# Patient Record
Sex: Female | Born: 1965 | Race: White | Hispanic: No | Marital: Married | Smoking: Former smoker
Health system: Southern US, Community
[De-identification: ages and names within clinical notes are randomized; demographics above are authoritative.]

## PROBLEM LIST (undated history)

## (undated) DIAGNOSIS — Z9889 Other specified postprocedural states: Secondary | ICD-10-CM

## (undated) DIAGNOSIS — E559 Vitamin D deficiency, unspecified: Secondary | ICD-10-CM

## (undated) DIAGNOSIS — Z780 Asymptomatic menopausal state: Secondary | ICD-10-CM

## (undated) DIAGNOSIS — K649 Unspecified hemorrhoids: Secondary | ICD-10-CM

## (undated) DIAGNOSIS — J302 Other seasonal allergic rhinitis: Secondary | ICD-10-CM

## (undated) DIAGNOSIS — L509 Urticaria, unspecified: Secondary | ICD-10-CM

## (undated) DIAGNOSIS — R112 Nausea with vomiting, unspecified: Secondary | ICD-10-CM

## (undated) DIAGNOSIS — C539 Malignant neoplasm of cervix uteri, unspecified: Secondary | ICD-10-CM

## (undated) HISTORY — PX: TUBAL LIGATION: SHX77

## (undated) HISTORY — DX: Asymptomatic menopausal state: Z78.0

## (undated) HISTORY — DX: Urticaria, unspecified: L50.9

## (undated) HISTORY — PX: ENDOMETRIAL ABLATION: SHX621

## (undated) HISTORY — PX: HEMORRHOID SURGERY: SHX153

## (undated) HISTORY — DX: Vitamin D deficiency, unspecified: E55.9

---

## 1998-11-08 ENCOUNTER — Other Ambulatory Visit: Admission: RE | Admit: 1998-11-08 | Discharge: 1998-11-08 | Payer: Self-pay | Admitting: Obstetrics and Gynecology

## 1999-07-14 ENCOUNTER — Inpatient Hospital Stay (HOSPITAL_COMMUNITY): Admission: AD | Admit: 1999-07-14 | Discharge: 1999-07-17 | Payer: Self-pay | Admitting: Obstetrics and Gynecology

## 1999-07-14 ENCOUNTER — Encounter (INDEPENDENT_AMBULATORY_CARE_PROVIDER_SITE_OTHER): Payer: Self-pay

## 1999-08-23 ENCOUNTER — Other Ambulatory Visit: Admission: RE | Admit: 1999-08-23 | Discharge: 1999-08-23 | Payer: Self-pay | Admitting: Obstetrics and Gynecology

## 2002-12-16 ENCOUNTER — Other Ambulatory Visit: Admission: RE | Admit: 2002-12-16 | Discharge: 2002-12-16 | Payer: Self-pay | Admitting: Obstetrics and Gynecology

## 2003-02-27 ENCOUNTER — Ambulatory Visit (HOSPITAL_COMMUNITY): Admission: RE | Admit: 2003-02-27 | Discharge: 2003-02-27 | Payer: Self-pay | Admitting: Gastroenterology

## 2004-01-21 ENCOUNTER — Other Ambulatory Visit: Admission: RE | Admit: 2004-01-21 | Discharge: 2004-01-21 | Payer: Self-pay | Admitting: Obstetrics and Gynecology

## 2005-04-27 ENCOUNTER — Other Ambulatory Visit: Admission: RE | Admit: 2005-04-27 | Discharge: 2005-04-27 | Payer: Self-pay | Admitting: Obstetrics and Gynecology

## 2008-02-20 ENCOUNTER — Ambulatory Visit (HOSPITAL_COMMUNITY): Admission: RE | Admit: 2008-02-20 | Discharge: 2008-02-20 | Payer: Self-pay | Admitting: Surgery

## 2008-02-20 ENCOUNTER — Encounter (INDEPENDENT_AMBULATORY_CARE_PROVIDER_SITE_OTHER): Payer: Self-pay | Admitting: Surgery

## 2010-12-13 NOTE — Op Note (Signed)
Regina Salinas, Regina Salinas                  ACCOUNT NO.:  [id number]   MEDICAL RECORD NO.:  [id number]          PATIENT TYPE:  AMB   LOCATION:  SDS                          FACILITY:  [facility]   PHYSICIAN:  Thomas A. Cornett, M.D.DATE OF BIRTH:  1966-05-26   DATE OF PROCEDURE:  02/20/2008  DATE OF DISCHARGE:  02/20/2008                               OPERATIVE REPORT   PREOPERATIVE DIAGNOSIS:  Grade 3 prolapsed internal and external  hemorrhoids.   POSTOPERATIVE DIAGNOSIS:  Grade 3 prolapsed internal and external  hemorrhoids.   PROCEDURE:  Two columns internal/external hemorrhoidectomy.   SURGEON:  Maisie Fus A. Cornett, MD   ANESTHESIA:  General endotracheal anesthesia with 0.25% Sensorcaine  local.   ESTIMATED BLOOD LOSS:  50 mL.   SPECIMEN:  Hemorrhoid tissue to pathology.   INDICATIONS FOR PROCEDURE:  The patient is a 45 year old female who has  had a longstanding history of internal and external hemorrhoids.  She  had been treated with medical management and injection therapy, but  unfortunately, her disease has not responded well to this.  She presents  for hemorrhoidectomy after discussion of hemorrhoidectomy versus PPH.  She understands the pros and cons of age and wished to undergo a formal  excision hemorrhoidectomy.   DESCRIPTION OF PROCEDURE:  The patient was brought to the operating room  and placed supine.  After induction of general anesthesia, the patient  was placed in stirrups and the perianal region was prepped and draped in  sterile fashion.  Digital examination was performed first.  The tone was  normal.  Upon placement of the anoscope, she had two large piles  involving the left lateral and right lateral positions.  The left  lateral was addressed first.  This was grabbed with an Allis clamp.  A  stitch was placed in the apex of the hemorrhoid.  The entire hemorrhoid  was excised down with the external component taking care not to injure  the sphincter mechanism.   This was closed with a running 3-0 Monocryl  leaving the bottom open.  The next pile on the right side was  identified.  The stitch was placed in a similar fashion using 3-0  Monocryl in the apex.  The hemorrhoid pile was excised.  This was then  closed with a running 3-0 Monocryl.  The bottom was left open.  There  was some oozing from the skin and this was controlled with cautery.  The  remainder of her anoscopy was normal.  The sphincter was intact.  I then  injected with 0.25% Sensorcaine in a circumanal pattern.  We then placed  Surgicel and Gelfoam as a plug in the anal canal.  Lidocaine 2% gel was then applied.  All final counts of sponge, needle,  and instruments were found be correct at this portion of the case.  The  patient was awoke and taken to recovery after being taken out of  lithotomy in satisfactory condition.      Thomas A. Cornett, M.D.  Electronically Signed     TAC/MEDQ  D:  02/20/2008  T:  02/21/2008  Job:  [id number]   cc:   Juluis Mire, M.D.  Graylin Shiver, M.D.

## 2010-12-16 NOTE — Op Note (Signed)
   NAME:  Regina Salinas, Regina Salinas                            ACCOUNT NO.:  [id number]   MEDICAL RECORD NO.:  [id number]                   PATIENT TYPE:  AMB   LOCATION:  ENDO                                 FACILITY:  [facility]   PHYSICIAN:  Graylin Shiver, M.D.                DATE OF BIRTH:  Feb 25, 1966   DATE OF PROCEDURE:  DATE OF DISCHARGE:                                 OPERATIVE REPORT   PROCEDURE:  Colonoscopy.   INDICATION FOR PROCEDURE:  Rectal bleeding.   INFORMED CONSENT:  Informed consent was obtained after explanation of the  risks of bleeding, infection, and perforation.   PREMEDICATIONS:  1. Fentanyl 100 mcg IV.  2. Versed 9 mg IV.   DESCRIPTION OF PROCEDURE:  With the patient in the left lateral decubitus  position, a rectal exam was performed, and no masses were felt.  The Olympus  colonoscope was inserted into the rectum and advanced around the colon to  the cecum.  The cecal landmarks were identified.  The cecum and ascending  colon were normal.  The transverse colon was normal.  The descending colon,  sigmoid, and rectum were normal.  Some internal hemorrhoids were present.  She tolerated the procedure well without complications.   IMPRESSION:  Normal colonoscopy to the cecum with internal hemorrhoids.                                               Graylin Shiver, M.D.    Germain Osgood  D:  02/27/2003  T:  02/27/2003  Job:  [id number]   cc:   Juluis Mire, M.D.  [address] [place]  [place] [address]  Fax: [phone]

## 2010-12-16 NOTE — H&P (Signed)
[facility]  Patient:    Regina Salinas                          MRN: [id number] Proc. Date: 07/13/99 Adm. Date:  [id number] Attending:  Frederich Balding                         History and Physical  HISTORY OF PRESENT ILLNESS:   The patient is a 45 year old, gravida 3, para 2, married white female.  Her last menstrual period was on March 15, given the estimated date of confinement of December 20.  This gives her an estimated gestational age of [redacted] weeks.  This was consistent with initial examination and prior ultrasound.  She presents now for repeat cesarean section.  The patient was at risk for advanced maternal age, being 84 at the birth of this child.  She did undergo amniocentesis with a finding of a chromosomally normal female with normal amniotic fluid, alpha-fetoprotein levels.  She did have a cesarean section with her last pregnancy due to failure to progress.  Prior to hat though she had had a successful vaginal delivery.  After discussions of risks and benefits and possible trial of labor, the patient decided to proceed with a repeat cesarean section for which she comes in at the present time.  ALLERGIES:                    In terms of allergies she has no known drug allergies.  MEDICATIONS:                  Prenatal vitamins.  PRENATAL LABORATORY DATA:     The patient is A positive.  Negative antibody screen. Nonreactive serology.  Negative hepatitis B surface antigen.  Her tuberculin skin test was negative.  Group B strep was also negative.  Her 50 g Glucola was 89.   For past medical history, family history, and social history, please see prenatal records.  REVIEW OF SYSTEMS:            Noncontributory.  PHYSICAL EXAMINATION:  VITAL SIGNS:                  The patient is afebrile with stable vital signs.  HEENT:                        The patient normocephalic.  Pupils, equal, round,  reactive to light and  accommodation.  Extraocular movements were intact. Sclerae and conjunctivae clear.  Oropharynx clear.  LUNGS:                        Clear.  HEART:                        Regular rate and rhythm with 2/6 systolic ejection murmur.  No clicks or gallops.  ABDOMEN:                      Gravid uterus consistent with dates, otherwise unremarkable.  Well-healed low transverse incision.  PELVIC:                       On pelvic examination cervix is long and closed and vertex presenting.  EXTREMITIES:  Trace edema.  NEUROLOGICAL:                 Grossly within normal limits.  IMPRESSION:                   1. Intrauterine pregnancy, at term with prior                                  cesarean section and desirous of repeat.                               2. Advanced maternal age with normal amniocentesis                                  findings.  PLAN:                         The patient will undergo a repeat cesarean section. The risks and benefits have been discussed including the risk of infection, the  risk of hemorrhage that could necessitate transfusion with the risks of AIDS or  hepatitis, the risk of injury to adjacent organs such as bladder, bowel, or ureters that could require further exploratory surgery, the risk of deep vein thrombosis, and pulmonary embolus.  The patient expressed understanding of indications and risks and accepts them.  She also understands the potential for a trail of labor which she declined. DD:  07/14/99 TD:  07/14/99 Job: [id number]

## 2010-12-16 NOTE — Op Note (Signed)
[facility]  Patient:    Regina Salinas                          MRN: [id number] Proc. Date: 07/14/99 Adm. Date:  [id number] Attending:  Frederich Balding                           Operative Report  PREOPERATIVE DIAGNOSIS:       Intrauterine pregnancy at term with prior cesarean section, desires repeat.  Multiparity, desires sterility.  POSTOPERATIVE DIAGNOSIS:      Intrauterine pregnancy at term with prior cesarean section, desires repeat.  Multiparity, desires sterility.  OPERATION:                    Low transverse cesarean section with bilateral tubal ligation.  SURGEON:                      Juluis Mire, M.D.  ASSISTANT:  ANESTHESIA:                   Epidural.  ESTIMATED BLOOD LOSS:         800 cc.  PACKS AND DRAINS:             None.  BLOOD REPLACED:               None.  COMPLICATIONS:                None.  INDICATIONS:                  The patient is a 45 year old, gravida 3, para 2, married white female with prior cesarean section.  She is desirous of repeat. he option of trial of labor had been discussed and declined by the patient.  Also desirous of permanent sterilization.  Alternative forms of birth control were discussed.  A failure rate of 1 in 200 was quoted.  Failures can be in the form of ectopic pregnancy requiring further surgical management.  The risks of the overall surgery was discussed including the risks of infection.  The risks of hemorrhage that could necessitate transfusion with the risk of AIDS or hepatitis.  The risk of injury to adjacent organs including bladder, bowel, or ureters that could require further exploratory surgery.  The risk of deep venous thrombosis and pulmonary embolus.  DESCRIPTION OF PROCEDURE:     The patient was taken to the OR and placed in the  supine position with a left lateral tilt.  After a satisfactory level of epidural anesthesia was obtained, the abdomen was prepped  with Betadine and draped as a sterile field.  A prior low transverse incision was identified and excised. The incision was then extended through the subcutaneous tissue.  The fascia was entered sharply and the fascia was extended laterally.  The fascia was taken off of the  muscles superiorly and inferiorly.  The rectus muscles were separated in the midline.  The anterior peritoneum was entered sharply.  The incision to the peritoneum was extended both superiorly and inferiorly.  There was no evidence f injury to adjacent organs.  The bladder flap was easily developed.  A low transverse uterine incision was begun with the knife and extended laterally using manual traction.  The infant presented in the vertex presentation and was delivered with elevation of the head and fundal pressure.  The placenta  was then delivered manually.  The uterus was wiped free of remaining membranes and placenta.  The uterus was closed with a running locking suture of 0 chromic using a two layer closure technique with excellent hemostasis.  The uterus was then exteriorized.  Tubes and ovaries were identified and noted to be unremarkable.  The midsegment of each tube was grasped with a Babcock tenaculum.  A hole was made in the avascular area of the mesosalpinx using the cautery.  Individual ligatures of 0 plain catgut were used to ligate off a segment of tube.  The intervening segment of tube was  then excised.  The cut ends of the tubes were cauterized using the Bovie.  Both  sides were adequately hemostatic.  The uterus was returned to the abdominal cavity. The pelvic cavity was thoroughly irrigated.  Hemostasis was excellent.  Urine output remained clear and adequate.  Muscles were reapproximated with a running  suture of 3-0 Vicryl.  The fascia was closed with a running suture of 0 PDS. The skin was closed with staples and Steri-Strips.  Sponge, needle, and instrument counts were correct by  circulating nurse x 2.  Foley catheter remained clear at the time of closure.  The patient tolerated the procedure well and was returned to he recovery room in good condition. DD:  07/14/99 TD:  07/14/99 Job: [id number]

## 2011-04-28 LAB — CBC
Hemoglobin: 10.5 — ABNORMAL LOW
MCHC: 33.3
Platelets: 335
RDW: 15.2

## 2014-10-27 ENCOUNTER — Other Ambulatory Visit: Payer: Self-pay | Admitting: Obstetrics and Gynecology

## 2014-10-28 LAB — CYTOLOGY - PAP

## 2015-11-22 ENCOUNTER — Other Ambulatory Visit: Payer: Self-pay | Admitting: Obstetrics and Gynecology

## 2015-11-22 DIAGNOSIS — R928 Other abnormal and inconclusive findings on diagnostic imaging of breast: Secondary | ICD-10-CM

## 2015-11-26 ENCOUNTER — Ambulatory Visit
Admission: RE | Admit: 2015-11-26 | Discharge: 2015-11-26 | Disposition: A | Discharge status: Home / Self Care | Payer: 59 | Source: Ambulatory Visit | Attending: Obstetrics and Gynecology | Admitting: Obstetrics and Gynecology

## 2015-11-26 DIAGNOSIS — R928 Other abnormal and inconclusive findings on diagnostic imaging of breast: Secondary | ICD-10-CM

## 2016-05-08 ENCOUNTER — Other Ambulatory Visit: Payer: Self-pay | Admitting: Obstetrics and Gynecology

## 2016-05-08 DIAGNOSIS — N6001 Solitary cyst of right breast: Secondary | ICD-10-CM

## 2016-05-29 ENCOUNTER — Other Ambulatory Visit: Payer: 59

## 2016-06-27 ENCOUNTER — Ambulatory Visit
Admission: RE | Admit: 2016-06-27 | Discharge: 2016-06-27 | Disposition: A | Discharge status: Home / Self Care | Payer: 59 | Source: Ambulatory Visit | Attending: Obstetrics and Gynecology | Admitting: Obstetrics and Gynecology

## 2016-06-27 DIAGNOSIS — N6001 Solitary cyst of right breast: Secondary | ICD-10-CM

## 2018-02-15 ENCOUNTER — Encounter: Payer: Self-pay | Admitting: *Deleted

## 2018-02-15 ENCOUNTER — Other Ambulatory Visit: Payer: Self-pay | Admitting: *Deleted

## 2018-02-18 ENCOUNTER — Encounter: Payer: Self-pay | Admitting: Gynecologic Oncology

## 2018-02-18 ENCOUNTER — Inpatient Hospital Stay: Payer: 59 | Attending: Gynecologic Oncology | Admitting: Gynecologic Oncology

## 2018-02-18 VITALS — BP 110/59 | HR 56 | Temp 98.1°F | Resp 20 | Ht 64.0 in | Wt 178.1 lb

## 2018-02-18 DIAGNOSIS — F17210 Nicotine dependence, cigarettes, uncomplicated: Secondary | ICD-10-CM

## 2018-02-18 DIAGNOSIS — C801 Malignant (primary) neoplasm, unspecified: Secondary | ICD-10-CM | POA: Insufficient documentation

## 2018-02-18 DIAGNOSIS — R87610 Atypical squamous cells of undetermined significance on cytologic smear of cervix (ASC-US): Secondary | ICD-10-CM

## 2018-02-18 DIAGNOSIS — R87810 Cervical high risk human papillomavirus (HPV) DNA test positive: Secondary | ICD-10-CM | POA: Diagnosis not present

## 2018-02-18 NOTE — Patient Instructions (Addendum)
Plan to have a cold knife conization of the cervix and dilation and curettage of the uterus at the [facility] on February 21, 2018 with Dr. Everitt Amber.  You will receive a phone call from the pre-surgical RN to discuss instructions.  Please call for any questions or concerns.   Cervical Conization Cervical conization (cone biopsy) is a procedure in which a cone-shaped portion of the cervix is cut out so that it can be examined under a microscope. The procedure is done to check for cancer cells or cells that might turn into cancer (precancerous cells). You may have this procedure if:  You have abnormal bleeding from your cervix.  You had an abnormal Pap test.  Something abnormal was seen on your cervix during an exam.  This procedure is performed in either a health care provider's office or in an operating room. Tell a health care provider about:  Any allergies you have.  All medicines you are taking, including vitamins, herbs, eye drops, creams, and over-the-counter medicines.  Any problems you or family members have had with the use of anesthetic medicines.  Any blood disorders you have.  Any surgeries you have had.  Any medical conditions you have.  Your smoking habits.  When you normally have your period.  Whether you are pregnant or may be pregnant. What are the risks? Generally, this is a safe procedure. However, problems may occur, including:  Heavy bleeding for several days or weeks after the procedure.  Allergic reactions to medicines or dyes.  Increased risk of preterm labor in future pregnancies.  Infection (rare).  Damage to the cervix or other structures or organs (rare).  What happens before the procedure? Staying hydrated Follow instructions from your health care provider about hydration, which may include:  Up to 2 hours before the procedure - you may continue to drink clear liquids, such as water, clear fruit juice, black coffee,  and plain tea.  Eating and drinking restrictions Follow instructions from your health care provider about eating and drinking, which may include:  8 hours before the procedure - stop eating heavy meals or foods such as meat, fried foods, or fatty foods.  6 hours before the procedure - stop eating light meals or foods, such as toast or cereal.  6 hours before the procedure - stop drinking milk or drinks that contain milk.  2 hours before the procedure - stop drinking clear liquids.  General instructions  Do not douche, have sex, use tampons, or use any vaginal medicines before the procedure as told by your health care provider.  You may be asked to empty your bladder and bowel right before the procedure.  Ask your health care provider about: ? Changing or stopping your normal medicines. This is important if you take diabetes medicines or blood thinners. ? Taking medicines such as aspirin and ibuprofen. These medicines can thin your blood. Do not take these medicines before your procedure if your doctor tells you not to.  Plan to have someone take you home from the hospital or clinic. What happens during the procedure?  To reduce your risk of infection: ? Your health care team will wash or sanitize their hands. ? Your skin will be washed with soap. ? Hair may be removed from the surgical area.  You will undress from the waist down and be given a gown to wear.  You will lie on an examining table and put your feet in stirrups.  An IV tube  will be inserted into one of your veins.  You will be given one or more of the following: ? A medicine to help you relax (sedative). ? A medicine to numb the area (local anesthetic). ? A medicine to make you fall asleep (general anesthetic). ? A medicine that numbs the cervix (cervical block).  A lubricated device called a speculum will be inserted into your vagina. It will be used to spread open the walls of the vagina so your health care  provider can see the inside of the vagina and cervix better.  An instrument that has a magnifying lens and a light (colposcope) will let your health care provider examine the cervix more closely.  Your health care provider will apply a solution to your cervix. This turns abnormal areas a pale color.  A tissue sample will be removed from the cervix using one of the following methods: ? The cold knife method. In this method, the tissue is cut out with a knife (scalpel). ? The loop electrosurgical excision procedure (LEEP) method. In this method, the tissue is cut out with a thin wire that can burn (cauterize) the tissue with an electrical current. ? Laser treatment method. In this method, the tissue is cut out and then cauterized with a laser beam to prevent bleeding.  Your health care provider will apply a paste over the biopsy areas to help control bleeding.  The tissue sample will be examined under a microscope. The procedure may vary among health care providers and hospitals. What happens after the procedure?  Your blood pressure, heart rate, breathing rate, and blood oxygen level will be monitored often until the medicines you were given have worn off.  If you were given a local anesthetic, you will rest at the clinic or hospital until you are stable and feel ready to go home.  If you were given a general anesthetic, you may be monitored for a longer period of time.  You may have some cramping.  You may have bloody discharge or light to moderate bleeding.  You may have dark discharge coming from your vagina. This is from the paste used on the cervix to prevent bleeding. Summary  Cervical conization is a procedure in which a cone-shaped portion of the cervix is cut out so that it can be examined under a microscope.  The procedure is done to check for cancer cells or cells that might turn into cancer (precancerous cells). This information is not intended to replace advice given to  you by your health care provider. Make sure you discuss any questions you have with your health care provider. Document Released: 04/26/2005 Document Revised: 07/19/2016 Document Reviewed: 07/19/2016 Elsevier Interactive Patient Education  2017 [employer]   Dilation and Curettage or Vacuum Curettage Dilation and curettage (D&C) and vacuum curettage are minor procedures. A D&C involves stretching (dilation) the cervix and scraping (curettage) the inside lining of the uterus (endometrium). During a D&C, tissue is gently scraped from the endometrium, starting from the top portion of the uterus down to the lowest part of the uterus (cervix). During a vacuum curettage, the lining and tissue in the uterus are removed with the use of gentle suction. Curettage may be performed to either diagnose or treat a problem. As a diagnostic procedure, curettage is performed to examine tissues from the uterus. A diagnostic curettage may be done if you have:  Irregular bleeding in the uterus.  Bleeding with the development of clots.  Spotting between menstrual periods.  Prolonged  menstrual periods or other abnormal bleeding.  Bleeding after menopause.  No menstrual period (amenorrhea).  A change in size and shape of the uterus.  Abnormal endometrial cells discovered during a Pap test.  As a treatment procedure, curettage may be performed for the following reasons:  Removal of an IUD (intrauterine device).  Removal of retained placenta after giving birth.  Abortion.  Miscarriage.  Removal of endometrial polyps.  Removal of uncommon types of noncancerous lumps (fibroids).  Tell a health care provider about:  Any allergies you have, including allergies to prescribed medicine or latex.  All medicines you are taking, including vitamins, herbs, eye drops, creams, and over-the-counter medicines. This is especially important if you take any blood-thinning medicine. Bring a list of all of your  medicines to your appointment.  Any problems you or family members have had with anesthetic medicines.  Any blood disorders you have.  Any surgeries you have had.  Your medical history and any medical conditions you have.  Whether you are pregnant or may be pregnant.  Recent vaginal infections you have had.  Recent menstrual periods, bleeding problems you have had, and what form of birth control (contraception) you use. What are the risks? Generally, this is a safe procedure. However, problems may occur, including:  Infection.  Heavy vaginal bleeding.  Allergic reactions to medicines.  Damage to the cervix or other structures or organs.  Development of scar tissue (adhesions) inside the uterus, which can cause abnormal amounts of menstrual bleeding. This may make it harder to get pregnant in the future.  A hole (perforation) or puncture in the uterine wall. This is rare.  What happens before the procedure? Staying hydrated Follow instructions from your health care provider about hydration, which may include:  Up to 2 hours before the procedure - you may continue to drink clear liquids, such as water, clear fruit juice, black coffee, and plain tea.  Eating and drinking restrictions Follow instructions from your health care provider about eating and drinking, which may include:  8 hours before the procedure - stop eating heavy meals or foods such as meat, fried foods, or fatty foods.  6 hours before the procedure - stop eating light meals or foods, such as toast or cereal.  6 hours before the procedure - stop drinking milk or drinks that contain milk.  2 hours before the procedure - stop drinking clear liquids. If your health care provider told you to take your medicine(s) on the day of your procedure, take them with only a sip of water.  Medicines  Ask your health care provider about: ? Changing or stopping your regular medicines. This is especially important if you  are taking diabetes medicines or blood thinners. ? Taking medicines such as aspirin and ibuprofen. These medicines can thin your blood. Do not take these medicines before your procedure if your health care provider instructs you not to.  You may be given antibiotic medicine to help prevent infection. General instructions  For 24 hours before your procedure, do not: ? Douche. ? Use tampons. ? Use medicines, creams, or suppositories in the vagina. ? Have sexual intercourse.  You may be given a pregnancy test on the day of the procedure.  Plan to have someone take you home from the hospital or clinic.  You may have a blood or urine sample taken.  If you will be going home right after the procedure, plan to have someone with you for 24 hours. What happens during the procedure?  To reduce your risk of infection: ? Your health care team will wash or sanitize their hands. ? Your skin will be washed with soap.  An IV tube will be inserted into one of your veins.  You will be given one of the following: ? A medicine that numbs the area in and around the cervix (local anesthetic). ? A medicine to make you fall asleep (general anesthetic).  You will lie down on your back, with your feet in foot rests (stirrups).  The size and position of your uterus will be checked.  A lubricated instrument (speculum or Sims retractor) will be inserted into the back side of your vagina. The speculum will be used to hold apart the walls of your vagina so your health care provider can see your cervix.  A tool (tenaculum) will be attached to the lip of the cervix to stabilize it.  Your cervix will be softened and dilated. This may be done by: ? Taking a medicine. ? Having tapered dilators or thin rods (laminaria) or gradual widening instruments (tapered dilators) inserted into your cervix.  A small, sharp, curved instrument (curette) will be used to scrape a small amount of tissue or cells from the  endometrium or cervical canal. In some cases, gentle suction is applied with the curette. The curette will then be removed. The cells will be taken to a lab for testing. The procedure may vary among health care providers and hospitals. What happens after the procedure?  You may have mild cramping, backache, pain, and light bleeding or spotting. You may pass small blood clots from your vagina.  You may have to wear compression stockings. These stockings help to prevent blood clots and reduce swelling in your legs.  Your blood pressure, heart rate, breathing rate, and blood oxygen level will be monitored until the medicines you were given have worn off. Summary  Dilation and curettage (D&C) involves stretching (dilation) the cervix and scraping (curettage) the inside lining of the uterus (endometrium).  After the procedure, you may have mild cramping, backache, pain, and light bleeding or spotting. You may pass small blood clots from your vagina.  Plan to have someone take you home from the hospital or clinic. This information is not intended to replace advice given to you by your health care provider. Make sure you discuss any questions you have with your health care provider. Document Released: 07/17/2005 Document Revised: 04/02/2016 Document Reviewed: 04/02/2016 Elsevier Interactive Patient Education  2018 [employer].

## 2018-02-18 NOTE — Progress Notes (Signed)
Consult Note: Gyn-Onc  Consult was requested by Dr. Radene Knee for the evaluation of Regina Salinas 52 y.o. female  CC:  Chief Complaint  Patient presents with  . Adenocarcinoma [facility])    Assessment/Plan:  [name]  is a 52 y.o.  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  On examination there is no macroscopic lesion on the cervix, and therefore this is a cervical primary, it is likely a stage IA process.  There is a possibility that this could be extension to the cervix from a primary endometrial cancer, however this would be unlikely in the setting of an absence of vaginal bleeding symptoms, and in the concurrence of an abnormal Pap smear with high risk HPV detected.  Therefore I am favoring the likelihood of this being a primary cervical process.  I discussed with [name] that differentiating the 2 sites of origin are critically important when determining the most appropriate type of hysterectomy (radical versus extrafascial) that should be performed.  In order to best differentiate this and to stage of microscopic cervical cancer, I am recommending proceeding with a cold knife conization of the cervix and D&C procedure on February 21, 2018.  I explained the procedure of a cone and its associated risks including bleeding, infection, damage to adjacent structures.  After we have determined the pathology definitively, we will then proceed with preoperative imaging if appropriate followed by hysterectomy as the patient has completed childbearing.  Hysterectomy would not be appropriate if pathology revealed a locally invasive process or metastatic disease on the preoperative work-up.  I will see her back approximately 2 weeks after a cone biopsy and D&C to discuss the pathology and plan for definitive treatment.   HPI: Regina Salinas is a very pleasant 51 year old P3 who was seen in consultation at the request of Dr. Radene Knee for adenocarcinoma of the endocervix and cervix.  The  patient was seen for routine gynecologic evaluation with Dr. Renold Don on January 23, 2018.  At that time a Pap test was performed with HPV testing.  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  To follow this up she then underwent a colposcopic examination of the cervix on February 07, 2018.  Review of the colposcopy notes revealed an adequate colposcopy.  She did have acetowhite changes with slight punctation located on the left side of the cervix near the opening.  This was biopsied along with endocervical curettings taken.  The pathology from this colposcopy with biopsies revealed in the cervical biopsy at 3:00 adenocarcinoma, and in the endocervical curettage adenocarcinoma.  The comment and pathology was that both of the specimens show fragments of adenocarcinoma and while some of the features suggest an endometrial origin, differential includes endocervical and endometrial origin.  There is minimal stroma present in the tumor fragments which limits evaluation of invasion.  The patient denies having symptoms of postmenopausal bleeding, postcoital bleeding, abnormal discharge, or pelvic pain.  She underwent endometrial ablation in, approximately 2012.  She has been amenorrheic since this time.  There is evidence of a benign endometrial biopsy in 2011.  She reports a personal history of an abnormal Pap smear in 1990, that was treated with cryotherapy.  She had an additional abnormal Pap smears some years later however this was cleared with treatment for an infection.  She denies having abnormal Pap smears since that time.  There is evidence of a cytologically benign Pap smear in 2016.  HPV testing was not performed on that specimen.  The patient's family history is significant for a maternal grandmother with a history of colon cancer, and a maternal aunt with a history of cervical cancer.  The patient herself is fairly healthy.  She is  not obese.  She does have a history of sporadic allergic reactions with hives and swelling on one side of her body.  She is undergone multiple allergy testing, however no explanation has been revealed.  Surgical history is significant for 2 prior cesarean sections.  Of note she is also had one vaginal delivery.  She also had an endometrial ablation as previously stated.  She is a non-smoker.   Current Meds:  Outpatient Encounter Medications as of 02/18/2018  Medication Sig  . cetirizine (ZYRTEC) 10 MG tablet Take 10 mg by mouth 2 (two) times daily.  . cholecalciferol (VITAMIN D) 1000 units tablet Take 1,000 Units by mouth daily.  . ranitidine (ZANTAC) 150 MG tablet Take 150 mg by mouth 2 (two) times daily.   No facility-administered encounter medications on file as of 02/18/2018.     Allergy:  Allergies  Allergen Reactions  . Other     Mild garlic    Social Hx:   Social History   Socioeconomic History  . Marital status: Married    Spouse name: Not on file  . Number of children: Not on file  . Years of education: Not on file  . Highest education level: Not on file  Occupational History  . Not on file  Social Needs  . Financial resource strain: Not on file  . Food insecurity:    Worry: Not on file    Inability: Not on file  . Transportation needs:    Medical: Not on file    Non-medical: Not on file  Tobacco Use  . Smoking status: Current Every Day Smoker    Packs/day: 0.50    Types: Cigarettes  . Smokeless tobacco: Never Used  . Tobacco comment: Patient has been smoking since she was 23., patient smokes less than half a pack  Substance and Sexual Activity  . Alcohol use: Yes    Comment: Ocassional  . Drug use: Never  . Sexual activity: Not on file  Lifestyle  . Physical activity:    Days per week: Not on file    Minutes per session: Not on file  . Stress: Not on file  Relationships  . Social connections:    Talks on phone: Not on file    Gets together: Not on  file    Attends religious service: Not on file    Active member of club or organization: Not on file    Attends meetings of clubs or organizations: Not on file    Relationship status: Not on file  . Intimate partner violence:    Fear of current or ex partner: Not on file    Emotionally abused: Not on file    Physically abused: Not on file    Forced sexual activity: Not on file  Other Topics Concern  . Not on file  Social History Narrative  . Not on file    Past Surgical Hx:  Past Surgical History:  Procedure Laterality Date  . CESAREAN SECTION     2000, 1995  . ENDOMETRIAL ABLATION    . OTHER SURGICAL HISTORY  2009 or 2010   Hemorrhoidectomy    Past Medical Hx:  Past Medical History:  Diagnosis Date  . Postmenopausal   .  Vitamin D deficiency     Past Gynecological History:  Remote history of abnormal pap with cryo in 1990. Normal pap in 2016. ASCUS pap in June, 2019 with high risk HPV detected. C/s x 2 and SVD x 1. Endometrial ablation in approximately 2012 (amenorrheic since that time). No LMP recorded.  Family Hx:  Family History  Problem Relation Age of Onset  . Hypertension Mother   . Lupus Sister   . Cervical cancer Maternal Aunt   . Colon cancer Maternal Grandfather     Review of Systems:  Constitutional  Feels well,   ENT Normal appearing ears and nares bilaterally Skin/Breast  No rash, sores, jaundice, itching, dryness Cardiovascular  No chest pain, shortness of breath, or edema  Pulmonary  No cough or wheeze.  Gastro Intestinal  No nausea, vomitting, or diarrhoea. No bright red blood per rectum, no abdominal pain, change in bowel movement, or constipation.  Genito Urinary  No frequency, urgency, dysuria, no bleeding or discharge. Musculo Skeletal  No myalgia, arthralgia, joint swelling or pain  Neurologic  No weakness, numbness, change in gait,  Psychology  No depression, anxiety, insomnia.   Vitals:  Blood pressure (!) 110/59, pulse (!) 56,  temperature 98.1 F (36.7 C), temperature source Oral, resp. rate 20, height 5\' 4"  (1.626 m), weight 178 lb 1.6 oz (80.8 kg), SpO2 100 %.  Physical Exam: WD in NAD Neck  Supple NROM, without any enlargements.  Lymph Node Survey No cervical supraclavicular or inguinal adenopathy Cardiovascular  Pulse normal rate, regularity and rhythm. S1 and S2 normal.  Lungs  Clear to auscultation bilateraly, without wheezes/crackles/rhonchi. Good air movement.  Skin  No rash/lesions/breakdown  Psychiatry  Alert and oriented to person, place, and time  Abdomen  Normoactive bowel sounds, abdomen soft, non-tender and thin without evidence of hernia.  Back No CVA tenderness Genito Urinary  Vulva/vagina: Normal external female genitalia.   No lesions. No discharge or bleeding.  Bladder/urethra:  No lesions or masses, well supported bladder  Vagina: normal  Cervix: Normal appearing, normal to palpate. There is a hyperemic area at 3 0'clock of the external os where the biopsy had been taken but no gross residual/visible tumor there.  Uterus:  Small, mobile, no parametrial involvement or nodularity.  Adnexa: no palpable masses. Rectal  Good tone, no masses no cul de sac nodularity. No parametrial disease palpated.  Extremities  No bilateral cyanosis, clubbing or edema.   [name], MD  02/18/2018, 10:01 AM

## 2018-02-18 NOTE — H&P (View-Only) (Signed)
Consult Note: Gyn-Onc  Consult was requested by Dr. Radene Knee for the evaluation of Regina Salinas 52 y.o. female  CC:  Chief Complaint  Patient presents with  . Adenocarcinoma [facility])    Assessment/Plan:  [name]  is a 52 y.o.  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  On examination there is no macroscopic lesion on the cervix, and therefore this is a cervical primary, it is likely a stage IA process.  There is a possibility that this could be extension to the cervix from a primary endometrial cancer, however this would be unlikely in the setting of an absence of vaginal bleeding symptoms, and in the concurrence of an abnormal Pap smear with high risk HPV detected.  Therefore I am favoring the likelihood of this being a primary cervical process.  I discussed with Regina Salinas that differentiating the 2 sites of origin are critically important when determining the most appropriate type of hysterectomy (radical versus extrafascial) that should be performed.  In order to best differentiate this and to stage of microscopic cervical cancer, I am recommending proceeding with a cold knife conization of the cervix and D&C procedure on February 21, 2018.  I explained the procedure of a cone and its associated risks including bleeding, infection, damage to adjacent structures.  After we have determined the pathology definitively, we will then proceed with preoperative imaging if appropriate followed by hysterectomy as the patient has completed childbearing.  Hysterectomy would not be appropriate if pathology revealed a locally invasive process or metastatic disease on the preoperative work-up.  I will see her back approximately 2 weeks after a cone biopsy and D&C to discuss the pathology and plan for definitive treatment.   HPI: Regina Salinas is a very pleasant 52 year old P3 who was seen in consultation at the request of Dr. Radene Knee for adenocarcinoma of the endocervix and cervix.  The  patient was seen for routine gynecologic evaluation with Dr. Renold Don on January 23, 2018.  At that time a Pap test was performed with HPV testing.  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  To follow this up she then underwent a colposcopic examination of the cervix on February 07, 2018.  Review of the colposcopy notes revealed an adequate colposcopy.  She did have acetowhite changes with slight punctation located on the left side of the cervix near the opening.  This was biopsied along with endocervical curettings taken.  The pathology from this colposcopy with biopsies revealed in the cervical biopsy at 3:00 adenocarcinoma, and in the endocervical curettage adenocarcinoma.  The comment and pathology was that both of the specimens show fragments of adenocarcinoma and while some of the features suggest an endometrial origin, differential includes endocervical and endometrial origin.  There is minimal stroma present in the tumor fragments which limits evaluation of invasion.  The patient denies having symptoms of postmenopausal bleeding, postcoital bleeding, abnormal discharge, or pelvic pain.  She underwent endometrial ablation in, approximately 2012.  She has been amenorrheic since this time.  There is evidence of a benign endometrial biopsy in 2011.  She reports a personal history of an abnormal Pap smear in 1990, that was treated with cryotherapy.  She had an additional abnormal Pap smears some years later however this was cleared with treatment for an infection.  She denies having abnormal Pap smears since that time.  There is evidence of a cytologically benign Pap smear in 2016.  HPV testing was not performed on that specimen.  The patient's family history is significant for a maternal grandmother with a history of colon cancer, and a maternal aunt with a history of cervical cancer.  The patient herself is fairly healthy.  She is  not obese.  She does have a history of sporadic allergic reactions with hives and swelling on one side of her body.  She is undergone multiple allergy testing, however no explanation has been revealed.  Surgical history is significant for 2 prior cesarean sections.  Of note she is also had one vaginal delivery.  She also had an endometrial ablation as previously stated.  She is a non-smoker.   Current Meds:  Outpatient Encounter Medications as of 02/18/2018  Medication Sig  . cetirizine (ZYRTEC) 10 MG tablet Take 10 mg by mouth 2 (two) times daily.  . cholecalciferol (VITAMIN D) 1000 units tablet Take 1,000 Units by mouth daily.  . ranitidine (ZANTAC) 150 MG tablet Take 150 mg by mouth 2 (two) times daily.   No facility-administered encounter medications on file as of 02/18/2018.     Allergy:  Allergies  Allergen Reactions  . Other     Mild garlic    Social Hx:   Social History   Socioeconomic History  . Marital status: Married    Spouse name: Not on file  . Number of children: Not on file  . Years of education: Not on file  . Highest education level: Not on file  Occupational History  . Not on file  Social Needs  . Financial resource strain: Not on file  . Food insecurity:    Worry: Not on file    Inability: Not on file  . Transportation needs:    Medical: Not on file    Non-medical: Not on file  Tobacco Use  . Smoking status: Current Every Day Smoker    Packs/day: 0.50    Types: Cigarettes  . Smokeless tobacco: Never Used  . Tobacco comment: Patient has been smoking since she was 60., patient smokes less than half a pack  Substance and Sexual Activity  . Alcohol use: Yes    Comment: Ocassional  . Drug use: Never  . Sexual activity: Not on file  Lifestyle  . Physical activity:    Days per week: Not on file    Minutes per session: Not on file  . Stress: Not on file  Relationships  . Social connections:    Talks on phone: Not on file    Gets together: Not on  file    Attends religious service: Not on file    Active member of club or organization: Not on file    Attends meetings of clubs or organizations: Not on file    Relationship status: Not on file  . Intimate partner violence:    Fear of current or ex partner: Not on file    Emotionally abused: Not on file    Physically abused: Not on file    Forced sexual activity: Not on file  Other Topics Concern  . Not on file  Social History Narrative  . Not on file    Past Surgical Hx:  Past Surgical History:  Procedure Laterality Date  . CESAREAN SECTION     2000, 1995  . ENDOMETRIAL ABLATION    . OTHER SURGICAL HISTORY  2009 or 2010   Hemorrhoidectomy    Past Medical Hx:  Past Medical History:  Diagnosis Date  . Postmenopausal   .  Vitamin D deficiency     Past Gynecological History:  Remote history of abnormal pap with cryo in 1990. Normal pap in 2016. ASCUS pap in June, 2019 with high risk HPV detected. C/s x 2 and SVD x 1. Endometrial ablation in approximately 2012 (amenorrheic since that time). No LMP recorded.  Family Hx:  Family History  Problem Relation Age of Onset  . Hypertension Mother   . Lupus Sister   . Cervical cancer Maternal Aunt   . Colon cancer Maternal Grandfather     Review of Systems:  Constitutional  Feels well,   ENT Normal appearing ears and nares bilaterally Skin/Breast  No rash, sores, jaundice, itching, dryness Cardiovascular  No chest pain, shortness of breath, or edema  Pulmonary  No cough or wheeze.  Gastro Intestinal  No nausea, vomitting, or diarrhoea. No bright red blood per rectum, no abdominal pain, change in bowel movement, or constipation.  Genito Urinary  No frequency, urgency, dysuria, no bleeding or discharge. Musculo Skeletal  No myalgia, arthralgia, joint swelling or pain  Neurologic  No weakness, numbness, change in gait,  Psychology  No depression, anxiety, insomnia.   Vitals:  Blood pressure (!) 110/59, pulse (!) 56,  temperature 98.1 F (36.7 C), temperature source Oral, resp. rate 20, height 5\' 4"  (1.626 m), weight 178 lb 1.6 oz (80.8 kg), SpO2 100 %.  Physical Exam: WD in NAD Neck  Supple NROM, without any enlargements.  Lymph Node Survey No cervical supraclavicular or inguinal adenopathy Cardiovascular  Pulse normal rate, regularity and rhythm. S1 and S2 normal.  Lungs  Clear to auscultation bilateraly, without wheezes/crackles/rhonchi. Good air movement.  Skin  No rash/lesions/breakdown  Psychiatry  Alert and oriented to person, place, and time  Abdomen  Normoactive bowel sounds, abdomen soft, non-tender and thin without evidence of hernia.  Back No CVA tenderness Genito Urinary  Vulva/vagina: Normal external female genitalia.   No lesions. No discharge or bleeding.  Bladder/urethra:  No lesions or masses, well supported bladder  Vagina: normal  Cervix: Normal appearing, normal to palpate. There is a hyperemic area at 3 0'clock of the external os where the biopsy had been taken but no gross residual/visible tumor there.  Uterus:  Small, mobile, no parametrial involvement or nodularity.  Adnexa: no palpable masses. Rectal  Good tone, no masses no cul de sac nodularity. No parametrial disease palpated.  Extremities  No bilateral cyanosis, clubbing or edema.   [name], MD  02/18/2018, 10:01 AM

## 2018-02-19 ENCOUNTER — Encounter (HOSPITAL_BASED_OUTPATIENT_CLINIC_OR_DEPARTMENT_OTHER): Payer: Self-pay | Admitting: *Deleted

## 2018-02-19 ENCOUNTER — Other Ambulatory Visit: Payer: Self-pay

## 2018-02-19 NOTE — Progress Notes (Signed)
Spoke w/ pt via phone for pre-op interview.  Npo after mn w/ exception clear liquids until 0600 (no cream/milk products).  Arrive at 1000.  Will take am meds w/ sips of water dos.

## 2018-02-21 ENCOUNTER — Ambulatory Visit (HOSPITAL_BASED_OUTPATIENT_CLINIC_OR_DEPARTMENT_OTHER): Payer: 59 | Admitting: Certified Registered Nurse Anesthetist

## 2018-02-21 ENCOUNTER — Encounter (HOSPITAL_BASED_OUTPATIENT_CLINIC_OR_DEPARTMENT_OTHER)
Admission: RE | Disposition: A | Discharge status: Home / Self Care | Payer: Self-pay | Source: Ambulatory Visit | Attending: Gynecologic Oncology

## 2018-02-21 ENCOUNTER — Encounter (HOSPITAL_BASED_OUTPATIENT_CLINIC_OR_DEPARTMENT_OTHER): Payer: Self-pay

## 2018-02-21 ENCOUNTER — Ambulatory Visit (HOSPITAL_BASED_OUTPATIENT_CLINIC_OR_DEPARTMENT_OTHER)
Admission: RE | Admit: 2018-02-21 | Discharge: 2018-02-21 | Disposition: A | Discharge status: Home / Self Care | Payer: 59 | Source: Ambulatory Visit | Attending: Gynecologic Oncology | Admitting: Gynecologic Oncology

## 2018-02-21 DIAGNOSIS — Z91018 Allergy to other foods: Secondary | ICD-10-CM | POA: Insufficient documentation

## 2018-02-21 DIAGNOSIS — Z8489 Family history of other specified conditions: Secondary | ICD-10-CM | POA: Insufficient documentation

## 2018-02-21 DIAGNOSIS — F17210 Nicotine dependence, cigarettes, uncomplicated: Secondary | ICD-10-CM | POA: Insufficient documentation

## 2018-02-21 DIAGNOSIS — Z8249 Family history of ischemic heart disease and other diseases of the circulatory system: Secondary | ICD-10-CM | POA: Diagnosis not present

## 2018-02-21 DIAGNOSIS — C538 Malignant neoplasm of overlapping sites of cervix uteri: Secondary | ICD-10-CM

## 2018-02-21 DIAGNOSIS — Z79899 Other long term (current) drug therapy: Secondary | ICD-10-CM | POA: Insufficient documentation

## 2018-02-21 DIAGNOSIS — C541 Malignant neoplasm of endometrium: Secondary | ICD-10-CM | POA: Diagnosis not present

## 2018-02-21 DIAGNOSIS — R87810 Cervical high risk human papillomavirus (HPV) DNA test positive: Secondary | ICD-10-CM

## 2018-02-21 DIAGNOSIS — C539 Malignant neoplasm of cervix uteri, unspecified: Secondary | ICD-10-CM

## 2018-02-21 DIAGNOSIS — Z8049 Family history of malignant neoplasm of other genital organs: Secondary | ICD-10-CM | POA: Diagnosis not present

## 2018-02-21 DIAGNOSIS — C530 Malignant neoplasm of endocervix: Secondary | ICD-10-CM | POA: Insufficient documentation

## 2018-02-21 DIAGNOSIS — C801 Malignant (primary) neoplasm, unspecified: Secondary | ICD-10-CM | POA: Diagnosis present

## 2018-02-21 DIAGNOSIS — Z800 Family history of malignant neoplasm of digestive organs: Secondary | ICD-10-CM | POA: Insufficient documentation

## 2018-02-21 DIAGNOSIS — K219 Gastro-esophageal reflux disease without esophagitis: Secondary | ICD-10-CM | POA: Diagnosis not present

## 2018-02-21 DIAGNOSIS — R87610 Atypical squamous cells of undetermined significance on cytologic smear of cervix (ASC-US): Secondary | ICD-10-CM | POA: Diagnosis present

## 2018-02-21 HISTORY — PX: DILATION AND CURETTAGE OF UTERUS: SHX78

## 2018-02-21 HISTORY — PX: CERVICAL CONIZATION W/BX: SHX1330

## 2018-02-21 HISTORY — DX: Nausea with vomiting, unspecified: R11.2

## 2018-02-21 HISTORY — DX: Malignant neoplasm of cervix uteri, unspecified: C53.9

## 2018-02-21 HISTORY — DX: Other seasonal allergic rhinitis: J30.2

## 2018-02-21 HISTORY — DX: Other specified postprocedural states: Z98.890

## 2018-02-21 HISTORY — DX: Unspecified hemorrhoids: K64.9

## 2018-02-21 SURGERY — CONE BIOPSY, CERVIX
Anesthesia: General | Site: Uterus

## 2018-02-21 MED ORDER — DEXAMETHASONE SODIUM PHOSPHATE 10 MG/ML IJ SOLN
INTRAMUSCULAR | Status: AC
  Filled 2018-02-21: qty 1

## 2018-02-21 MED ORDER — SENNA 8.6 MG PO TABS: 1.0000 | ORAL_TABLET | Freq: Every day | ORAL | 0 refills | Status: DC

## 2018-02-21 MED ORDER — SCOPOLAMINE 1 MG/3DAYS TD PT72
MEDICATED_PATCH | TRANSDERMAL | Status: AC
  Filled 2018-02-21: qty 1

## 2018-02-21 MED ORDER — MIDAZOLAM HCL 2 MG/2ML IJ SOLN
INTRAMUSCULAR | Status: AC
  Filled 2018-02-21: qty 2

## 2018-02-21 MED ORDER — LIDOCAINE HCL (CARDIAC) PF 100 MG/5ML IV SOSY
PREFILLED_SYRINGE | INTRAVENOUS | Status: DC | PRN
Start: 2018-02-21 — End: 2018-02-21
  Administered 2018-02-21: 80 mg via INTRAVENOUS

## 2018-02-21 MED ORDER — KETOROLAC TROMETHAMINE 30 MG/ML IJ SOLN
30.0000 mg | Freq: Once | INTRAMUSCULAR | Status: DC | PRN
  Filled 2018-02-21: qty 1

## 2018-02-21 MED ORDER — SCOPOLAMINE 1 MG/3DAYS TD PT72
MEDICATED_PATCH | TRANSDERMAL | Status: DC | PRN
  Administered 2018-02-21: 1 via TRANSDERMAL

## 2018-02-21 MED ORDER — PROPOFOL 10 MG/ML IV BOLUS
INTRAVENOUS | Status: DC | PRN
  Administered 2018-02-21: 200 mg via INTRAVENOUS

## 2018-02-21 MED ORDER — GLYCOPYRROLATE 0.2 MG/ML IJ SOLN
INTRAMUSCULAR | Status: DC | PRN
  Administered 2018-02-21: 0.2 mg via INTRAVENOUS

## 2018-02-21 MED ORDER — FENTANYL CITRATE (PF) 100 MCG/2ML IJ SOLN
INTRAMUSCULAR | Status: DC | PRN
  Administered 2018-02-21 (×2): 50 ug via INTRAVENOUS

## 2018-02-21 MED ORDER — ONDANSETRON HCL 4 MG/2ML IJ SOLN
INTRAMUSCULAR | Status: AC
  Filled 2018-02-21: qty 2

## 2018-02-21 MED ORDER — OXYCODONE-ACETAMINOPHEN 5-325 MG PO TABS: 1.0000 | ORAL_TABLET | ORAL | 0 refills | Status: DC | PRN

## 2018-02-21 MED ORDER — LACTATED RINGERS IV SOLN
INTRAVENOUS | Status: DC
  Administered 2018-02-21: 10:00:00 via INTRAVENOUS
  Filled 2018-02-21: qty 1000

## 2018-02-21 MED ORDER — 0.9 % SODIUM CHLORIDE (POUR BTL) OPTIME
TOPICAL | Status: DC | PRN
  Administered 2018-02-21: 500 mL

## 2018-02-21 MED ORDER — PROMETHAZINE HCL 25 MG/ML IJ SOLN
6.2500 mg | INTRAMUSCULAR | Status: DC | PRN
  Filled 2018-02-21: qty 1

## 2018-02-21 MED ORDER — PHENYLEPHRINE 40 MCG/ML (10ML) SYRINGE FOR IV PUSH (FOR BLOOD PRESSURE SUPPORT)
PREFILLED_SYRINGE | INTRAVENOUS | Status: DC | PRN
  Administered 2018-02-21: 80 ug via INTRAVENOUS

## 2018-02-21 MED ORDER — FENTANYL CITRATE (PF) 100 MCG/2ML IJ SOLN
25.0000 ug | INTRAMUSCULAR | Status: DC | PRN
  Filled 2018-02-21: qty 1

## 2018-02-21 MED ORDER — MIDAZOLAM HCL 2 MG/2ML IJ SOLN
INTRAMUSCULAR | Status: DC | PRN
  Administered 2018-02-21: 2 mg via INTRAVENOUS

## 2018-02-21 MED ORDER — FENTANYL CITRATE (PF) 100 MCG/2ML IJ SOLN
INTRAMUSCULAR | Status: AC
  Filled 2018-02-21: qty 2

## 2018-02-21 MED ORDER — DEXAMETHASONE SODIUM PHOSPHATE 10 MG/ML IJ SOLN
INTRAMUSCULAR | Status: DC | PRN
  Administered 2018-02-21: 10 mg via INTRAVENOUS

## 2018-02-21 MED ORDER — FERRIC SUBSULFATE SOLN
Status: DC | PRN
  Administered 2018-02-21: 1 via TOPICAL

## 2018-02-21 MED ORDER — GLYCOPYRROLATE PF 0.2 MG/ML IJ SOSY
PREFILLED_SYRINGE | INTRAMUSCULAR | Status: AC
  Filled 2018-02-21: qty 1

## 2018-02-21 MED ORDER — LIDOCAINE 2% (20 MG/ML) 5 ML SYRINGE
INTRAMUSCULAR | Status: AC
  Filled 2018-02-21: qty 5

## 2018-02-21 MED ORDER — ONDANSETRON HCL 4 MG/2ML IJ SOLN
INTRAMUSCULAR | Status: DC | PRN
  Administered 2018-02-21: 4 mg via INTRAVENOUS

## 2018-02-21 MED ORDER — PROPOFOL 10 MG/ML IV BOLUS
INTRAVENOUS | Status: AC
  Filled 2018-02-21: qty 20

## 2018-02-21 SURGICAL SUPPLY — 27 items
APPLICATOR COTTON TIP 6IN STRL (MISCELLANEOUS) ×3 IMPLANT
BLADE EXTENDED COATED 6.5IN (ELECTRODE) ×6 IMPLANT
BLADE SURG 11 STRL SS (BLADE) ×3 IMPLANT
CANISTER SUCT 3000ML PPV (MISCELLANEOUS) ×3 IMPLANT
CATH ROBINSON RED A/P 16FR (CATHETERS) ×3 IMPLANT
DRSG TELFA 3X8 NADH (GAUZE/BANDAGES/DRESSINGS) ×3 IMPLANT
ELECT BALL LEEP 5MM RED (ELECTRODE) IMPLANT
GLOVE BIO SURGEON STRL SZ 6 (GLOVE) ×6 IMPLANT
GOWN STRL REUS W/ TWL LRG LVL3 (GOWN DISPOSABLE) ×4 IMPLANT
GOWN STRL REUS W/TWL LRG LVL3 (GOWN DISPOSABLE) ×5 IMPLANT
KIT TURNOVER CYSTO (KITS) ×3 IMPLANT
NS IRRIG 500ML POUR BTL (IV SOLUTION) ×3 IMPLANT
PACK VAGINAL MINOR WOMEN LF (CUSTOM PROCEDURE TRAY) ×3 IMPLANT
PACK VAGINAL WOMENS (CUSTOM PROCEDURE TRAY) ×3 IMPLANT
PAD OB MATERNITY 4.3X12.25 (PERSONAL CARE ITEMS) ×3 IMPLANT
PAD PREP 24X48 CUFFED NSTRL (MISCELLANEOUS) ×3 IMPLANT
SPONGE SURGIFOAM ABS GEL 12-7 (HEMOSTASIS) IMPLANT
SUT VIC AB 0 CT1 36 (SUTURE) ×6 IMPLANT
SUT VIC AB 2-0 CT2 27 (SUTURE) IMPLANT
SUT VIC AB 2-0 UR6 27 (SUTURE) ×6 IMPLANT
SUT VICRYL 0 UR6 27IN ABS (SUTURE) ×6 IMPLANT
SWAB OB GYN 8IN STERILE 2PK (MISCELLANEOUS) ×3 IMPLANT
SYR BULB IRRIGATION 50ML (SYRINGE) ×3 IMPLANT
TOWEL OR 17X24 6PK STRL BLUE (TOWEL DISPOSABLE) ×3 IMPLANT
TUBE CONNECTING 12X1/4 (SUCTIONS) ×3 IMPLANT
VACUUM HOSE/TUBING 7/8INX6FT (MISCELLANEOUS) IMPLANT
WATER STERILE IRR 500ML POUR (IV SOLUTION) ×3 IMPLANT

## 2018-02-21 NOTE — Anesthesia Postprocedure Evaluation (Signed)
Anesthesia Post Note  Patient: Regina Salinas  Procedure(s) Performed: COLD KNIFE CONIZATION CERVIX (N/A Cervix) DILATATION AND CURETTAGE OF THE UTERUS (N/A Uterus)     Patient location during evaluation: PACU Anesthesia Type: General Level of consciousness: awake and alert and oriented Pain management: pain level controlled Vital Signs Assessment: post-procedure vital signs reviewed and stable Respiratory status: spontaneous breathing, nonlabored ventilation and respiratory function stable Cardiovascular status: blood pressure returned to baseline and stable Postop Assessment: no apparent nausea or vomiting Anesthetic complications: no    Last Vitals:  Vitals:   02/21/18 1245 02/21/18 1255  BP: 119/74   Pulse: 67   Resp: 16   Temp: (!) 36.4 C 36.5 C  SpO2: 100% 100%    Last Pain:  Vitals:   02/21/18 1255  TempSrc:   PainSc: 0-No pain                 Gamal Todisco A.

## 2018-02-21 NOTE — Transfer of Care (Signed)
Immediate Anesthesia Transfer of Care Note  Patient: Regina Salinas  Procedure(s) Performed: COLD KNIFE CONIZATION CERVIX (N/A Cervix) DILATATION AND CURETTAGE OF THE UTERUS (N/A Uterus)  Patient Location: PACU  Anesthesia Type:General  Level of Consciousness: awake, alert , oriented and patient cooperative  Airway & Oxygen Therapy: Patient Spontanous Breathing and Patient connected to nasal cannula oxygen  Post-op Assessment: Report given to RN and Post -op Vital signs reviewed and stable  Post vital signs: Reviewed and stable  Last Vitals:  Vitals Value Taken Time  BP    Temp    Pulse    Resp    SpO2      Last Pain:  Vitals:   02/21/18 1007  TempSrc:   PainSc: 0-No pain      Patients Stated Pain Goal: 5 (57/49/[id number])  Complications: No apparent anesthesia complications

## 2018-02-21 NOTE — Interval H&P Note (Signed)
History and Physical Interval Note:  02/21/2018 11:04 AM  Regina Salinas  has presented today for surgery, with the diagnosis of cervical cancer  The various methods of treatment have been discussed with the patient and family. After consideration of risks, benefits and other options for treatment, the patient has consented to  Procedure(s): COLD KNIFE CONIZATION CERVIX (N/A) DILATATION AND CURETTAGE OF THE UTERUS (N/A) as a surgical intervention .  The patient's history has been reviewed, patient examined, no change in status, stable for surgery.  I have reviewed the patient's chart and labs.  Questions were answered to the patient's satisfaction.     [name]

## 2018-02-21 NOTE — Op Note (Signed)
PATIENT: Regina Salinas DATE: 02/21/18   Preop Diagnosis: adenocarcinoma of the cervix, possible endometrial cancer  Postoperative Diagnosis: same  Surgery: cold knife conization of cervix, D&C  Surgeons:  Donaciano Eva, MD Assistant: none  Anesthesia: General   Estimated blood loss: <43ml  IVF:  253ml   Urine output: 245 ml   Complications: None   Pathology: cervical cone with marking stitch at 12 o'clock, post cone ECC, endometrial curettings  Operative findings: ectocervix grossly normal, though there was extrusion of tumor from the os with manipulation of the cervix. Uterus sounded only to 4cm (likely due to stenosis from prior ablation). Fleshy friable tumor in cervical stroma seen during conization consistent with either endocervical primary versus extension of endometrial cancer.   Procedure: The patient was identified in the preoperative holding area. Informed consent was signed on the chart. Patient was seen history was reviewed and exam was performed.   The patient was then taken to the operating room and placed in the supine position with SCD hose on. General anesthesia was then induced without difficulty. She was then placed in the dorsolithotomy position. The perineum was prepped with Betadine. The vagina was prepped with Betadine. The patient was then draped after the prep was dried. An in and out catheterization to empty the bladder was performed under sterile conditions.  Timeout was performed the patient, procedure, antibiotic, allergy, and length of procedure.   The weighted speculum was placed in the posterior vagina. The right angle retractor was placed anteriorally to visualize the cervix. A 0-vicryl suture on a UR-6 needle was used to place stay sutures (which were tagged) at 3 and 9 o'clock of the cervicovaginal junction. 10cc of 1% lidocaine with epinephrine was infiltrated into 3 and 9 o'clock of the cervicovaginal junction, circumferentially  around the face of the cervix, and deep in the endocervical canal. The uterine sound was placed in the cervix to delineate the course of the endocervical canal. An 11 blade scalpel on a long knife handle was used to make an incision around the face of the cervix, inside of the stay sutures, circumferentially. A single tooth tenaculum grasped the specimen to manipulate it. The incision was then angled towards the endocervix to amputate the specimen. It was removed, oriented with a marking stitch at the 12 o'clock ectocervix and sent to pathology. It was measured to be 3cm deep, by 2x2cm on the ectocervical face.   A post-cone ECC was collected from the endocervical canal using a kavorkian currette.  The cervix was gently dilated at the residual endocervix and the currette was then advaned to the endometrium (though this was limited due to stenosis).  An endometrial curetting took place.  The bovie with the spatula attachment was used at 45 coag to create hemostasis at the surgical bed. Monsels solution was applied to the surgical bed to consolidate this hemostasis.  The vagina was irrigated.  All instrument, suture, laparotomy, Ray-Tec, and needle counts were correct x2. The patient tolerated the procedure well and was taken recovery room in stable condition. This is Regina Salinas dictating an operative note on Regina Salinas.

## 2018-02-21 NOTE — Discharge Instructions (Signed)
Cold Knife Conization, Care After ACTIVITY  Rest as much as possible the first two days after discharge.  You do not have weight restrictions on lifting  Avoid strenuous working out such as running or lifting weights for 48-72 hours  You can climb stairs and drive a car NUTRITION  You may resume your normal diet.  Drink 6 to 8 glasses of fluids a day.  Eat a healthy, balanced diet including portions of food from the meat (protein), milk, fruit, vegetable, and bread groups.  Your caregiver may recommend you take a multivitamin with iron.  ELIMINATION   If constipation occurs, drink more liquids, and add more fruits, vegetables, and bran to your diet. You may take a mild laxative, such as Milk of Magnesia, Metamucil, or a stool softener such as Colace, with permission from your caregiver.  HYGIENE  You may shower and wash your hair.  Avoid tub baths for 4 weeks  Do not add any bath oils or chemicals to your bath water, after you have permission to take baths.  Avoid placing anything in the vagina for 4 weeks.  It is normal to pass a brown-flecked discharge from the vagina for several weeks after your procedure.  HOME CARE INSTRUCTIONS   Take your temperature twice a day and record it, especially if you feel feverish or have chills.  Follow your caregiver's instructions about medicines, activity, and follow-up appointments after surgery.  Do not drink alcohol while taking pain medicine.  You may take over-the-counter medicine for pain, recommended by your caregiver.  If your pain is not relieved with medicine, call your caregiver.  Do not take aspirin because it can cause bleeding.  Do not douche or use tampons (use a nonperfumed sanitary pad).  Do not have sexual intercourse for 6 weeks postoperatively. Hugging, kissing, and playful sexual activity is fine with your caregiver's permission.  Take showers instead of baths, until your caregiver gives you permission  to take baths.  You may take a mild medicine for constipation, recommended by your caregiver. Bran foods and drinking a lot of fluids will help with constipation.  Make sure your family understands everything about your operation and recovery.  SEEK MEDICAL CARE IF:    You notice a foul smell coming from the vagina.  You have painful or bloody urination.  You develop nausea and vomiting.  You develop diarrhea.  You develop a rash.  You have a reaction or allergy from the medicine.  You feel dizzy or light-headed.  You need stronger pain medicine.   SEEK IMMEDIATE MEDICAL CARE IF:   You develop a temperature of 102 F (38.9 C) or higher.  You pass out.  You develop leg or chest pain.  You develop abdominal pain.  You develop shortness of breath.  You bleed heavier than a period (soaking through 2 or more pads per hour for 2 hours in a row).  You see pus in the wound area.  MAKE SURE YOU:   Understand these instructions.  Will watch your condition.  Will get help right away if you are not doing well or get worse. Document Released: 02/29/2004 Document Revised: 12/01/2013 Document Reviewed: 06/18/2009 [facility] Patient Information 2015 [place], [place]. This information is not intended to replace advice given to you by your health care provider. Make sure you discuss any questions you have with your health care provider.     Post Anesthesia Home Care Instructions  Activity: Get plenty of rest for the remainder of the day.  A responsible individual must stay with you for 24 hours following the procedure.  For the next 24 hours, DO NOT: -Drive a car -Paediatric nurse -Drink alcoholic beverages -Take any medication unless instructed by your physician -Make any legal decisions or sign important papers.  Meals: Start with liquid foods such as gelatin or soup. Progress to regular foods as tolerated. Avoid greasy, spicy, heavy foods. If nausea and/or vomiting  occur, drink only clear liquids until the nausea and/or vomiting subsides. Call your physician if vomiting continues.  Special Instructions/Symptoms: Your throat may feel dry or sore from the anesthesia or the breathing tube placed in your throat during surgery. If this causes discomfort, gargle with warm salt water. The discomfort should disappear within 24 hours.  If you had a scopolamine patch placed behind your ear for the management of post- operative nausea and/or vomiting:  1. The medication in the patch is effective for 72 hours, after which it should be removed.  Wrap patch in a tissue and discard in the trash. Wash hands thoroughly with soap and water. 2. You may remove the patch earlier than 72 hours if you experience unpleasant side effects which may include dry mouth, dizziness or visual disturbances. 3. Avoid touching the patch. Wash your hands with soap and water after contact with the patch.

## 2018-02-21 NOTE — Anesthesia Procedure Notes (Signed)
Procedure Name: LMA Insertion Date/Time: 02/21/2018 11:20 AM Performed by: Lyndee Leo, CRNA Pre-anesthesia Checklist: Patient identified, Emergency Drugs available, Suction available and Patient being monitored Patient Re-evaluated:Patient Re-evaluated prior to induction Oxygen Delivery Method: Circle system utilized Preoxygenation: Pre-oxygenation with 100% oxygen Induction Type: IV induction Ventilation: Mask ventilation without difficulty LMA: LMA inserted LMA Size: 4.0 Number of attempts: 1 Airway Equipment and Method: Bite block Placement Confirmation: positive ETCO2 Tube secured with: Tape Dental Injury: Teeth and Oropharynx as per pre-operative assessment

## 2018-02-21 NOTE — Anesthesia Preprocedure Evaluation (Signed)
Anesthesia Evaluation  Patient identified by MRN, date of birth, ID band Patient awake    Reviewed: Allergy & Precautions, NPO status , Patient's Chart, lab work & pertinent test results  History of Anesthesia Complications (+) PONV  Airway Mallampati: II  TM Distance: >3 FB Neck ROM: Full    Dental no notable dental hx.    Pulmonary neg pulmonary ROS, Current Smoker,    Pulmonary exam normal breath sounds clear to auscultation       Cardiovascular negative cardio ROS Normal cardiovascular exam Rhythm:Regular Rate:Normal     Neuro/Psych negative neurological ROS  negative psych ROS   GI/Hepatic Neg liver ROS, GERD  ,  Endo/Other  negative endocrine ROS  Renal/GU negative Renal ROS  negative genitourinary   Musculoskeletal negative musculoskeletal ROS (+)   Abdominal   Peds negative pediatric ROS (+)  Hematology negative hematology ROS (+)   Anesthesia Other Findings   Reproductive/Obstetrics negative OB ROS                             Anesthesia Physical Anesthesia Plan  ASA: II  Anesthesia Plan: General   Post-op Pain Management:    Induction: Intravenous  PONV Risk Score and Plan: 3 and Ondansetron, Dexamethasone, Treatment may vary due to age or medical condition and Scopolamine patch - Pre-op  Airway Management Planned: LMA  Additional Equipment:   Intra-op Plan:   Post-operative Plan: Extubation in OR  Informed Consent: I have reviewed the patients History and Physical, chart, labs and discussed the procedure including the risks, benefits and alternatives for the proposed anesthesia with the patient or authorized representative who has indicated his/her understanding and acceptance.   Dental advisory given  Plan Discussed with: CRNA and Surgeon  Anesthesia Plan Comments:         Anesthesia Quick Evaluation

## 2018-02-22 ENCOUNTER — Encounter (HOSPITAL_BASED_OUTPATIENT_CLINIC_OR_DEPARTMENT_OTHER): Payer: Self-pay | Admitting: Gynecologic Oncology

## 2018-02-27 ENCOUNTER — Other Ambulatory Visit: Payer: Self-pay | Admitting: Gynecologic Oncology

## 2018-02-27 ENCOUNTER — Telehealth: Payer: Self-pay | Admitting: Gynecologic Oncology

## 2018-02-27 DIAGNOSIS — C530 Malignant neoplasm of endocervix: Secondary | ICD-10-CM

## 2018-02-27 DIAGNOSIS — D4959 Neoplasm of unspecified behavior of other genitourinary organ: Secondary | ICD-10-CM

## 2018-02-27 NOTE — Progress Notes (Signed)
Pet ordered per Dr. Denman George for Stage IB cervical cancer.

## 2018-02-27 NOTE — Telephone Encounter (Signed)
Attempted to call patient to discuss path results and Dr. Serita Grit recommendations for a PET scan.  Left message asking her to please call the office to discuss.

## 2018-03-02 NOTE — Progress Notes (Signed)
Follow-up Note: Gyn-Onc  Consult was requested by Dr. Radene Knee for the evaluation of Regina Salinas 52 y.o. female  CC:  Chief Complaint  Patient presents with  . Malignant neoplasm of endocervix [facility])    Assessment/Plan:  Regina Salinas  is a 52 y.o.  year old with stage IB2/IB2 adenocarcinoma of endocervix.  We reviewed the patient's diagnosis of early stage cervical cancer. On today's examination she is a clinical stage IB2 with a gross lesion estimated at 2cm. Treatment options include radical hysterectomy with pelvic lymphadenectomy or radiation. The risks and benefits of both were reviewed. I have emphasized that the cure rates for these two treatments are equivalent; however, I discussed that the toxicity profiles are different between these two modalities.  Risks of primary radiotherapy with sensitizing chemotherapy can include cystitis, proctitis, chronic rectal bleeding sigmoid stricture, small bowel obstruction, ureteral stricture, urinary or enteral fistula, loss of sexual function related to vaginal shortening and atrophy and menopause due to loss of ovarian function.  Surgery is associated with risks of bleeding, blood transfusion, injury to the bowel, bladder, ureter, urinary fistula, vaginal dehiscence, prolonged or permanent bladder dysfunction potentially requiring self-catheterization, lymphedema, infection, thromboembolic events, and possible effects on sexual function. We discussed that intraoperatively, the finding of previously unsuspected spread outside the cervix may lead to the surgery being aborted. Additionally, high or intermediate-risk factors on the final pathology report, such as positive lymph nodes, deep invasion, large tumor size and lymphatic invasion, will likely lead to a recommendation of postoperative chemotherapy and radiation.  She desires to proceed with radical hysterectomy. Prior to surgery, a PET/CT has been/will be ordered to rule out any distant  metastasis that would preclude the patient from being a surgical candidate.  Assuming that the patient is a surgical candidate following imaging, radical hysterectomy via a minimally invasive approach or abdominal procedure was discussed. We reviewed that prior to early 2018 our practice had been to routinely proceed with a robotic assisted procedure. We discussed that prospective data with endometrial cancer showed that outcomes from a minimally invasive procedure were not inferior as compared to one performed via laparotomy. We also discussed that retrospective data evaluating minimally invasive radical hysterectomy had not demonstrated worse outcomes as compared to an abdominal procedure. However, recent prospective data has suggested that there may be worse outcomes from a minimally invasive procedure. This data was published in November, 2018. A retrospective study from a [place] cancer database also seems to support this conclusion. We reviewed the results of these two studies.  Furthermore, we discussed that retrospective survival analysis of our practice from 2000-2017 demonstrates non-inferior oncologic outcomes based on our minimally invasive radical technique. The pros and cons of both procedures were discussed in detail based on available published data. These include, but are not limited to, blood loss, injury to surrounding organs, lymphedema, need for further surgery, wound complications, VTE, and long-term bladder and bowel dysfunction.  After our discussion, the patient wishes to proceed with robotic assisted radical hysterectomy, bilateral salpingectomy, lymphadenectomy.   We also specifically reviewed that her lesion size is currently <2cm based on today's examination. She is aware that both of the aforementioned 2018 studies state that there is inconclusive evidence to suggest that a minimally invasive approach is inferior to open laparotomy in this subset of patients. Therefore, we do not have  data to suggest recurrence outcomes vary at this time.  The patient and her husband had the opportunity to ask questions and their questions  were answered to their satisfaction. After our discussion, the patient has decided to proceed with a  robotic assisted radical hysterectomy and bilateral pelvic lymphadenectomy with lymphadenectomy. Because the patient is premenopausal, we recommend leaving ovaries in, but bilateral fallopian tubes will be removed. Postoperatively, the patient will have an indwelling catheter at the time of discharge. She also understands that additional therapy may be recommended based on the results of her final pathology.   REFERENCES:  1. Rosendo Gros PT, Frumovitz M, Pareja R, et al. Minimally invasive versus abdominal radical hysterectomy for cervicalcancer. N Engl J Med. DOI: 71.2458/[id number].\cb3  2. Melamed A, Margul DJ, Chen L, et al. Survival after minimally invasive radical hysterectomy for early-stage cervical cancer. N Engl J Med. JQB:34.1937/[id number]  3. Sert BM, Boggess JF, Heron Sabins, et al. Robot=assisted versus open radical hysterectomy: a multi-onstitutional experience for early-stage cervical cancer. Eur J Surg Oncol. 2016;42:513-22.  4. Concepcion Elk, Harlin Heys, [place], [place], Rodman Key. Surgical and oncologic outcomes after robotic radical hysterectomy as compared to open radical hysterectomy in the treatment of early cervical cancer. J Gynecol Oncol. 2017;28(6):e82.  5. Soliman PT, Frumovitz M, Sun CC, et al. Radical hysterectomy: a comparison of surgical approaches after adoption of robotic surgery in gynecologic oncology. Gynecol Oncol. 2011;123:333-336.iation  HPI: Regina Salinas is a very pleasant 51 year old P3 who was seen in consultation at the request of Dr. Radene Knee for adenocarcinoma of the endocervix and cervix.  The patient was seen for routine gynecologic evaluation with Dr. Renold Don on January 23, 2018.  At that time a Pap test was performed  with HPV testing.  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  To follow this up she then underwent a colposcopic examination of the cervix on February 07, 2018.  Review of the colposcopy notes revealed an adequate colposcopy.  She did have acetowhite changes with slight punctation located on the left side of the cervix near the opening.  This was biopsied along with endocervical curettings taken.  The pathology from this colposcopy with biopsies revealed in the cervical biopsy at 3:00 adenocarcinoma, and in the endocervical curettage adenocarcinoma.  The comment and pathology was that both of the specimens show fragments of adenocarcinoma and while some of the features suggest an endometrial origin, differential includes endocervical and endometrial origin.  There is minimal stroma present in the tumor fragments which limits evaluation of invasion.  The patient denies having symptoms of postmenopausal bleeding, postcoital bleeding, abnormal discharge, or pelvic pain.  She underwent endometrial ablation in, approximately 2012.  She has been amenorrheic since this time.  There is evidence of a benign endometrial biopsy in 2011.  She reports a personal history of an abnormal Pap smear in 1990, that was treated with cryotherapy.  She had an additional abnormal Pap smears some years later however this was cleared with treatment for an infection.  She denies having abnormal Pap smears since that time.  There is evidence of a cytologically benign Pap smear in 2016.  HPV testing was not performed on that specimen.  The patient's family history is significant for a maternal grandmother with a history of colon cancer, and a maternal aunt with a history of cervical cancer.  The patient herself is fairly healthy.  She is not obese.  She does have a history of sporadic allergic reactions with hives and swelling on one side of her body.  She is  undergone multiple allergy testing,  however no explanation has been revealed.  Surgical history is significant for 2 prior cesarean sections.  Of note she is also had one vaginal delivery.  She also had an endometrial ablation as previously stated.  She is a non-smoker.  Interval Hx: On 7/35/19 she underwent cold knife conization of the cervix. Final pathology confirmed: 1. Cervix, cone - ENDOCERVICAL ADENOCARCINOMA, MODERATELY DIFFERENTIATED. - ECTOCERVICAL AND DEEP RESECTION MARGINS ARE POSITIVE, SEE COMMENT. - TUMOR SPANS AT LEAST 1.4 CM IN LENGTH AND 0.4 CM DEEP. 2. Endocervix, curettage, post cone - ENDOCERVICAL ADENOCARCINOMA. 3. Endometrium, curettage - ENDOCERVICAL ADENOCARCINOMA. Microscopic Comment 1. The sections are somewhat fragmented due to tumor involvement. The anterior sections display abundant tumor which spans at least 1.4 cm and involves the endocervical margin. The tumor invades at least 0.4 cm and involves the deep/radial margin. The ectocervical margin appears negative. Lymphovascular invasion is not seen.  Since surgery she has done well with no complaints.  Current Meds:  Outpatient Encounter Medications as of 03/06/2018  Medication Sig  . cetirizine (ZYRTEC) 10 MG tablet Take 10 mg by mouth 2 (two) times daily.  . cholecalciferol (VITAMIN D) 1000 units tablet Take 1,000 Units by mouth every morning.   . ranitidine (ZANTAC) 150 MG tablet Take 150 mg by mouth 2 (two) times daily.   . [DISCONTINUED] oxyCODONE-acetaminophen (PERCOCET) 5-325 MG tablet Take 1-2 tablets by mouth every 4 (four) hours as needed for severe pain. (Patient not taking: Reported on 03/06/2018)  . [DISCONTINUED] senna (SENOKOT) 8.6 MG TABS tablet Take 1 tablet (8.6 mg total) by mouth at bedtime. (Patient not taking: Reported on 03/06/2018)   No facility-administered encounter medications on file as of 03/06/2018.     Allergy:  Allergies  Allergen Reactions  . Other Swelling    Mild garlic     Social Hx:   Social History   Socioeconomic History  . Marital status: Married    Spouse name: Not on file  . Number of children: Not on file  . Years of education: Not on file  . Highest education level: Not on file  Occupational History  . Not on file  Social Needs  . Financial resource strain: Not on file  . Food insecurity:    Worry: Not on file    Inability: Not on file  . Transportation needs:    Medical: Not on file    Non-medical: Not on file  Tobacco Use  . Smoking status: Current Every Day Smoker    Packs/day: 0.50    Years: 25.00    Pack years: 12.50    Types: Cigarettes  . Smokeless tobacco: Never Used  Substance and Sexual Activity  . Alcohol use: Yes    Comment: Ocassional  . Drug use: Never  . Sexual activity: Not on file  Lifestyle  . Physical activity:    Days per week: Not on file    Minutes per session: Not on file  . Stress: Not on file  Relationships  . Social connections:    Talks on phone: Not on file    Gets together: Not on file    Attends religious service: Not on file    Active member of club or organization: Not on file    Attends meetings of clubs or organizations: Not on file    Relationship status: Not on file  . Intimate partner violence:    Fear of current or ex partner: Not on file    Emotionally abused: Not on file  Physically abused: Not on file    Forced sexual activity: Not on file  Other Topics Concern  . Not on file  Social History Narrative  . Not on file    Past Surgical Hx:  Past Surgical History:  Procedure Laterality Date  . CERVICAL CONIZATION W/BX N/A 02/21/2018   Procedure: COLD KNIFE CONIZATION CERVIX;  Surgeon: Everitt Amber, MD;  Location: [facility];  Service: Gynecology;  Laterality: N/A;  . [place];  07-14-1999   dr Radene Knee [facility]   Bilateral Tubal Ligation w/ last one c/s  . DILATION AND CURETTAGE OF UTERUS N/A 02/21/2018   Procedure: DILATATION AND CURETTAGE OF THE  UTERUS;  Surgeon: Everitt Amber, MD;  Location: [facility];  Service: Gynecology;  Laterality: N/A;  . ENDOMETRIAL ABLATION  2002  approx.    dr Radene Knee  . HEMORRHOID SURGERY  02-20-2008    dr Brantley Stage  [facility]    Past Medical Hx:  Past Medical History:  Diagnosis Date  . Cervical cancer ([place])   . Hemorrhoids   . PONV (postoperative nausea and vomiting)   . Postmenopausal   . Seasonal allergies   . Vitamin D deficiency     Past Gynecological History:  Remote history of abnormal pap with cryo in 1990. Normal pap in 2016. ASCUS pap in June, 2019 with high risk HPV detected. C/s x 2 and SVD x 1. Endometrial ablation in approximately 2012 (amenorrheic since that time). No LMP recorded. Patient is postmenopausal.  Family Hx:  Family History  Problem Relation Age of Onset  . Hypertension Mother   . Lupus Sister   . Cervical cancer Maternal Aunt   . Colon cancer Maternal Grandfather     Review of Systems:  Constitutional  Feels well,   ENT Normal appearing ears and nares bilaterally Skin/Breast  No rash, sores, jaundice, itching, dryness Cardiovascular  No chest pain, shortness of breath, or edema  Pulmonary  No cough or wheeze.  Gastro Intestinal  No nausea, vomitting, or diarrhoea. No bright red blood per rectum, no abdominal pain, change in bowel movement, or constipation.  Genito Urinary  No frequency, urgency, dysuria, no bleeding or discharge. Musculo Skeletal  No myalgia, arthralgia, joint swelling or pain  Neurologic  No weakness, numbness, change in gait,  Psychology  No depression, anxiety, insomnia.   Vitals:  Blood pressure (!) 113/57, pulse (!) 52, temperature 98.4 F (36.9 C), temperature source Oral, resp. rate 20, height 5\' 4"  (1.626 m), weight 177 lb 11.2 oz (80.6 kg), SpO2 100 %.  Physical Exam: WD in NAD Neck  Supple NROM, without any enlargements.  Lymph Node Survey No cervical supraclavicular or inguinal adenopathy Cardiovascular   Pulse normal rate, regularity and rhythm. S1 and S2 normal.  Lungs  Clear to auscultation bilateraly, without wheezes/crackles/rhonchi. Good air movement.  Skin  No rash/lesions/breakdown  Psychiatry  Alert and oriented to person, place, and time  Abdomen  Normoactive bowel sounds, abdomen soft, non-tender and thin without evidence of hernia.  Back No CVA tenderness Genito Urinary  Cervical cone site healing well. No visible or palpable tumor. No palpable parametrial disease.  Rectal  Deferred (no parametrial disease appreciated on prior exam) Extremities  No bilateral cyanosis, clubbing or edema.   [name], MD  03/06/2018, 11:01 AM

## 2018-03-02 NOTE — H&P (View-Only) (Signed)
Follow-up Note: Gyn-Onc  Consult was requested by Dr. Radene Knee for the evaluation of Regina Salinas 52 y.o. female  CC:  Chief Complaint  Patient presents with  . Malignant neoplasm of endocervix [facility])    Assessment/Plan:  Regina Salinas  is a 52 y.o.  year old with stage IB2/IB2 adenocarcinoma of endocervix.  We reviewed the patient's diagnosis of early stage cervical cancer. On today's examination she is a clinical stage IB2 with a gross lesion estimated at 2cm. Treatment options include radical hysterectomy with pelvic lymphadenectomy or radiation. The risks and benefits of both were reviewed. I have emphasized that the cure rates for these two treatments are equivalent; however, I discussed that the toxicity profiles are different between these two modalities.  Risks of primary radiotherapy with sensitizing chemotherapy can include cystitis, proctitis, chronic rectal bleeding sigmoid stricture, small bowel obstruction, ureteral stricture, urinary or enteral fistula, loss of sexual function related to vaginal shortening and atrophy and menopause due to loss of ovarian function.  Surgery is associated with risks of bleeding, blood transfusion, injury to the bowel, bladder, ureter, urinary fistula, vaginal dehiscence, prolonged or permanent bladder dysfunction potentially requiring self-catheterization, lymphedema, infection, thromboembolic events, and possible effects on sexual function. We discussed that intraoperatively, the finding of previously unsuspected spread outside the cervix may lead to the surgery being aborted. Additionally, high or intermediate-risk factors on the final pathology report, such as positive lymph nodes, deep invasion, large tumor size and lymphatic invasion, will likely lead to a recommendation of postoperative chemotherapy and radiation.  She desires to proceed with radical hysterectomy. Prior to surgery, a PET/CT has been/will be ordered to rule out any distant  metastasis that would preclude the patient from being a surgical candidate.  Assuming that the patient is a surgical candidate following imaging, radical hysterectomy via a minimally invasive approach or abdominal procedure was discussed. We reviewed that prior to early 2018 our practice had been to routinely proceed with a robotic assisted procedure. We discussed that prospective data with endometrial cancer showed that outcomes from a minimally invasive procedure were not inferior as compared to one performed via laparotomy. We also discussed that retrospective data evaluating minimally invasive radical hysterectomy had not demonstrated worse outcomes as compared to an abdominal procedure. However, recent prospective data has suggested that there may be worse outcomes from a minimally invasive procedure. This data was published in November, 2018. A retrospective study from a [place] cancer database also seems to support this conclusion. We reviewed the results of these two studies.  Furthermore, we discussed that retrospective survival analysis of our practice from 2000-2017 demonstrates non-inferior oncologic outcomes based on our minimally invasive radical technique. The pros and cons of both procedures were discussed in detail based on available published data. These include, but are not limited to, blood loss, injury to surrounding organs, lymphedema, need for further surgery, wound complications, VTE, and long-term bladder and bowel dysfunction.  After our discussion, the patient wishes to proceed with robotic assisted radical hysterectomy, bilateral salpingectomy, lymphadenectomy.   We also specifically reviewed that her lesion size is currently <2cm based on today's examination. She is aware that both of the aforementioned 2018 studies state that there is inconclusive evidence to suggest that a minimally invasive approach is inferior to open laparotomy in this subset of patients. Therefore, we do not have  data to suggest recurrence outcomes vary at this time.  The patient and her husband had the opportunity to ask questions and their questions  were answered to their satisfaction. After our discussion, the patient has decided to proceed with a  robotic assisted radical hysterectomy and bilateral pelvic lymphadenectomy with lymphadenectomy. Because the patient is premenopausal, we recommend leaving ovaries in, but bilateral fallopian tubes will be removed. Postoperatively, the patient will have an indwelling catheter at the time of discharge. She also understands that additional therapy may be recommended based on the results of her final pathology.   REFERENCES:  1. Rosendo Gros PT, Frumovitz M, Pareja R, et al. Minimally invasive versus abdominal radical hysterectomy for cervicalcancer. N Engl J Med. DOI: 98.2641/[id number]  2. Melamed A, Margul DJ, Chen L, et al. Survival after minimally invasive radical hysterectomy for early-stage cervical cancer. N Engl J Med. PJS:31.5945/[id number]  3. Sert BM, Boggess JF, Heron Sabins, et al. Robot=assisted versus open radical hysterectomy: a multi-onstitutional experience for early-stage cervical cancer. Eur J Surg Oncol. 2016;42:513-22.  4. Concepcion Elk, Harlin Heys, [place], [place], [place]. Surgical and oncologic outcomes after robotic radical hysterectomy as compared to open radical hysterectomy in the treatment of early cervical cancer. J Gynecol Oncol. 2017;28(6):e82.  5. Soliman PT, Frumovitz M, Sun CC, et al. Radical hysterectomy: a comparison of surgical approaches after adoption of robotic surgery in gynecologic oncology. Gynecol Oncol. 2011;123:333-336.iation  HPI: Regina Salinas is a very pleasant 52 year old P3 who was seen in consultation at the request of Dr. Radene Knee for adenocarcinoma of the endocervix and cervix.  The patient was seen for routine gynecologic evaluation with Dr. Renold Don on January 23, 2018.  At that time a Pap test was performed  with HPV testing.  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  To follow this up she then underwent a colposcopic examination of the cervix on February 07, 2018.  Review of the colposcopy notes revealed an adequate colposcopy.  She did have acetowhite changes with slight punctation located on the left side of the cervix near the opening.  This was biopsied along with endocervical curettings taken.  The pathology from this colposcopy with biopsies revealed in the cervical biopsy at 3:00 adenocarcinoma, and in the endocervical curettage adenocarcinoma.  The comment and pathology was that both of the specimens show fragments of adenocarcinoma and while some of the features suggest an endometrial origin, differential includes endocervical and endometrial origin.  There is minimal stroma present in the tumor fragments which limits evaluation of invasion.  The patient denies having symptoms of postmenopausal bleeding, postcoital bleeding, abnormal discharge, or pelvic pain.  She underwent endometrial ablation in, approximately 2012.  She has been amenorrheic since this time.  There is evidence of a benign endometrial biopsy in 2011.  She reports a personal history of an abnormal Pap smear in 1990, that was treated with cryotherapy.  She had an additional abnormal Pap smears some years later however this was cleared with treatment for an infection.  She denies having abnormal Pap smears since that time.  There is evidence of a cytologically benign Pap smear in 2016.  HPV testing was not performed on that specimen.  The patient's family history is significant for a maternal grandmother with a history of colon cancer, and a maternal aunt with a history of cervical cancer.  The patient herself is fairly healthy.  She is not obese.  She does have a history of sporadic allergic reactions with hives and swelling on one side of her body.  She is  undergone multiple allergy testing,  however no explanation has been revealed.  Surgical history is significant for 2 prior cesarean sections.  Of note she is also had one vaginal delivery.  She also had an endometrial ablation as previously stated.  She is a non-smoker.  Interval Hx: On 7/35/19 she underwent cold knife conization of the cervix. Final pathology confirmed: 1. Cervix, cone - ENDOCERVICAL ADENOCARCINOMA, MODERATELY DIFFERENTIATED. - ECTOCERVICAL AND DEEP RESECTION MARGINS ARE POSITIVE, SEE COMMENT. - TUMOR SPANS AT LEAST 1.4 CM IN LENGTH AND 0.4 CM DEEP. 2. Endocervix, curettage, post cone - ENDOCERVICAL ADENOCARCINOMA. 3. Endometrium, curettage - ENDOCERVICAL ADENOCARCINOMA. Microscopic Comment 1. The sections are somewhat fragmented due to tumor involvement. The anterior sections display abundant tumor which spans at least 1.4 cm and involves the endocervical margin. The tumor invades at least 0.4 cm and involves the deep/radial margin. The ectocervical margin appears negative. Lymphovascular invasion is not seen.  Since surgery she has done well with no complaints.  Current Meds:  Outpatient Encounter Medications as of 03/06/2018  Medication Sig  . cetirizine (ZYRTEC) 10 MG tablet Take 10 mg by mouth 2 (two) times daily.  . cholecalciferol (VITAMIN D) 1000 units tablet Take 1,000 Units by mouth every morning.   . ranitidine (ZANTAC) 150 MG tablet Take 150 mg by mouth 2 (two) times daily.   . [DISCONTINUED] oxyCODONE-acetaminophen (PERCOCET) 5-325 MG tablet Take 1-2 tablets by mouth every 4 (four) hours as needed for severe pain. (Patient not taking: Reported on 03/06/2018)  . [DISCONTINUED] senna (SENOKOT) 8.6 MG TABS tablet Take 1 tablet (8.6 mg total) by mouth at bedtime. (Patient not taking: Reported on 03/06/2018)   No facility-administered encounter medications on file as of 03/06/2018.     Allergy:  Allergies  Allergen Reactions  . Other Swelling    Mild garlic     Social Hx:   Social History   Socioeconomic History  . Marital status: Married    Spouse name: Not on file  . Number of children: Not on file  . Years of education: Not on file  . Highest education level: Not on file  Occupational History  . Not on file  Social Needs  . Financial resource strain: Not on file  . Food insecurity:    Worry: Not on file    Inability: Not on file  . Transportation needs:    Medical: Not on file    Non-medical: Not on file  Tobacco Use  . Smoking status: Current Every Day Smoker    Packs/day: 0.50    Years: 25.00    Pack years: 12.50    Types: Cigarettes  . Smokeless tobacco: Never Used  Substance and Sexual Activity  . Alcohol use: Yes    Comment: Ocassional  . Drug use: Never  . Sexual activity: Not on file  Lifestyle  . Physical activity:    Days per week: Not on file    Minutes per session: Not on file  . Stress: Not on file  Relationships  . Social connections:    Talks on phone: Not on file    Gets together: Not on file    Attends religious service: Not on file    Active member of club or organization: Not on file    Attends meetings of clubs or organizations: Not on file    Relationship status: Not on file  . Intimate partner violence:    Fear of current or ex partner: Not on file    Emotionally abused: Not on file  Physically abused: Not on file    Forced sexual activity: Not on file  Other Topics Concern  . Not on file  Social History Narrative  . Not on file    Past Surgical Hx:  Past Surgical History:  Procedure Laterality Date  . CERVICAL CONIZATION W/BX N/A 02/21/2018   Procedure: COLD KNIFE CONIZATION CERVIX;  Surgeon: Everitt Amber, MD;  Location: [facility];  Service: Gynecology;  Laterality: N/A;  . [employer];  07-14-1999   dr Radene Knee [facility]   Bilateral Tubal Ligation w/ last one c/s  . DILATION AND CURETTAGE OF UTERUS N/A 02/21/2018   Procedure: DILATATION AND CURETTAGE OF THE  UTERUS;  Surgeon: Everitt Amber, MD;  Location: [facility];  Service: Gynecology;  Laterality: N/A;  . ENDOMETRIAL ABLATION  2002  approx.    dr Radene Knee  . HEMORRHOID SURGERY  02-20-2008    dr Brantley Stage  [facility]    Past Medical Hx:  Past Medical History:  Diagnosis Date  . Cervical cancer ([place])   . Hemorrhoids   . PONV (postoperative nausea and vomiting)   . Postmenopausal   . Seasonal allergies   . Vitamin D deficiency     Past Gynecological History:  Remote history of abnormal pap with cryo in 1990. Normal pap in 2016. ASCUS pap in June, 2019 with high risk HPV detected. C/s x 2 and SVD x 1. Endometrial ablation in approximately 2012 (amenorrheic since that time). No LMP recorded. Patient is postmenopausal.  Family Hx:  Family History  Problem Relation Age of Onset  . Hypertension Mother   . Lupus Sister   . Cervical cancer Maternal Aunt   . Colon cancer Maternal Grandfather     Review of Systems:  Constitutional  Feels well,   ENT Normal appearing ears and nares bilaterally Skin/Breast  No rash, sores, jaundice, itching, dryness Cardiovascular  No chest pain, shortness of breath, or edema  Pulmonary  No cough or wheeze.  Gastro Intestinal  No nausea, vomitting, or diarrhoea. No bright red blood per rectum, no abdominal pain, change in bowel movement, or constipation.  Genito Urinary  No frequency, urgency, dysuria, no bleeding or discharge. Musculo Skeletal  No myalgia, arthralgia, joint swelling or pain  Neurologic  No weakness, numbness, change in gait,  Psychology  No depression, anxiety, insomnia.   Vitals:  Blood pressure (!) 113/57, pulse (!) 52, temperature 98.4 F (36.9 C), temperature source Oral, resp. rate 20, height 5\' 4"  (1.626 m), weight 177 lb 11.2 oz (80.6 kg), SpO2 100 %.  Physical Exam: WD in NAD Neck  Supple NROM, without any enlargements.  Lymph Node Survey No cervical supraclavicular or inguinal adenopathy Cardiovascular   Pulse normal rate, regularity and rhythm. S1 and S2 normal.  Lungs  Clear to auscultation bilateraly, without wheezes/crackles/rhonchi. Good air movement.  Skin  No rash/lesions/breakdown  Psychiatry  Alert and oriented to person, place, and time  Abdomen  Normoactive bowel sounds, abdomen soft, non-tender and thin without evidence of hernia.  Back No CVA tenderness Genito Urinary  Cervical cone site healing well. No visible or palpable tumor. No palpable parametrial disease.  Rectal  Deferred (no parametrial disease appreciated on prior exam) Extremities  No bilateral cyanosis, clubbing or edema.   [name], MD  03/06/2018, 11:01 AM

## 2018-03-06 ENCOUNTER — Inpatient Hospital Stay: Payer: 59 | Attending: Gynecologic Oncology | Admitting: Gynecologic Oncology

## 2018-03-06 ENCOUNTER — Inpatient Hospital Stay: Payer: 59 | Admitting: Gynecologic Oncology

## 2018-03-06 ENCOUNTER — Encounter: Payer: Self-pay | Admitting: Gynecologic Oncology

## 2018-03-06 VITALS — BP 113/57 | HR 52 | Temp 98.4°F | Resp 20 | Ht 64.0 in | Wt 177.7 lb

## 2018-03-06 DIAGNOSIS — C530 Malignant neoplasm of endocervix: Secondary | ICD-10-CM

## 2018-03-06 NOTE — Patient Instructions (Signed)
Preparing for your Surgery  Plan for surgery on April 16, 2018 with Dr. Everitt Amber at [place] will be scheduled for a robotic assisted radical hysterectomy, bilateral salpingectomy, bilateral pelvic lymphadenectomy.  You will have a foley in place for one week after surgery and then will be brought back to the office for removal.  Pre-operative Testing -You will receive a phone call from presurgical testing at [facility] to arrange for a pre-operative testing appointment before your surgery.  This appointment normally occurs one to two weeks before your scheduled surgery.   -Bring your insurance card, copy of an advanced directive if applicable, medication list  -At that visit, you will be asked to sign a consent for a possible blood transfusion in case a transfusion becomes necessary during surgery.  The need for a blood transfusion is rare but having consent is a necessary part of your care.     -You should not be taking blood thinners or aspirin at least ten days prior to surgery unless instructed by your surgeon.  Day Before Surgery at [facility] will be asked to take in a light diet the day before surgery.  Avoid carbonated beverages.  You will be advised to have nothing to eat or drink after midnight the evening before.    Eat a light diet the day before surgery.  Examples including soups, broths, toast, yogurt, mashed potatoes.  Things to avoid include carbonated beverages (fizzy beverages), raw fruits and raw vegetables, or beans.   If your bowels are filled with gas, your surgeon will have difficulty visualizing your pelvic organs which increases your surgical risks.  Your role in recovery Your role is to become active as soon as directed by your doctor, while still giving yourself time to heal.  Rest when you feel tired. You will be asked to do the following in order to speed your recovery:  - Cough and breathe deeply. This helps  toclear and expand your lungs and can prevent pneumonia. You may be given a spirometer to practice deep breathing. A staff member will show you how to use the spirometer. - Do mild physical activity. Walking or moving your legs help your circulation and body functions return to normal. A staff member will help you when you try to walk and will provide you with simple exercises. Do not try to get up or walk alone the first time. - Actively manage your pain. Managing your pain lets you move in comfort. We will ask you to rate your pain on a scale of zero to 10. It is your responsibility to tell your doctor or nurse where and how much you hurt so your pain can be treated.  Special Considerations -If you are diabetic, you may be placed on insulin after surgery to have closer control over your blood sugars to promote healing and recovery.  This does not mean that you will be discharged on insulin.  If applicable, your oral antidiabetics will be resumed when you are tolerating a solid diet.  -Your final pathology results from surgery should be available by the Friday after surgery and the results will be relayed to you when available.  -Dr. Lahoma Crocker is the Surgeon that assists your GYN Oncologist with surgery.  The next day after your surgery you will either see your GYN Oncologist or Dr. Lahoma Crocker.   Blood Transfusion Information WHAT IS A BLOOD TRANSFUSION? A transfusion is the replacement of blood or some of its  parts. Blood is made up of multiple cells which provide different functions.  Red blood cells carry oxygen and are used for blood loss replacement.  White blood cells fight against infection.  Platelets control bleeding.  Plasma helps clot blood.  Other blood products are available for specialized needs, such as hemophilia or other clotting disorders. BEFORE THE TRANSFUSION  Who gives blood for transfusions?   You may be able to donate blood to be used at a later  date on yourself (autologous donation).  Relatives can be asked to donate blood. This is generally not any safer than if you have received blood from a stranger. The same precautions are taken to ensure safety when a relative's blood is donated.  Healthy volunteers who are fully evaluated to make sure their blood is safe. This is blood bank blood. Transfusion therapy is the safest it has ever been in the practice of medicine. Before blood is taken from a donor, a complete history is taken to make sure that person has no history of diseases nor engages in risky social behavior (examples are intravenous drug use or sexual activity with multiple partners). The donor's travel history is screened to minimize risk of transmitting infections, such as malaria. The donated blood is tested for signs of infectious diseases, such as HIV and hepatitis. The blood is then tested to be sure it is compatible with you in order to minimize the chance of a transfusion reaction. If you or a relative donates blood, this is often done in anticipation of surgery and is not appropriate for emergency situations. It takes many days to process the donated blood. RISKS AND COMPLICATIONS Although transfusion therapy is very safe and saves many lives, the main dangers of transfusion include:   Getting an infectious disease.  Developing a transfusion reaction. This is an allergic reaction to something in the blood you were given. Every precaution is taken to prevent this. The decision to have a blood transfusion has been considered carefully by your caregiver before blood is given. Blood is not given unless the benefits outweigh the risks.

## 2018-03-11 ENCOUNTER — Encounter (HOSPITAL_COMMUNITY)
Admission: RE | Admit: 2018-03-11 | Discharge: 2018-03-11 | Disposition: A | Discharge status: Home / Self Care | Payer: 59 | Source: Ambulatory Visit | Attending: Gynecologic Oncology | Admitting: Gynecologic Oncology

## 2018-03-11 DIAGNOSIS — C530 Malignant neoplasm of endocervix: Secondary | ICD-10-CM | POA: Insufficient documentation

## 2018-03-11 LAB — GLUCOSE, CAPILLARY: Glucose-Capillary: 95 mg/dL (ref 70–99)

## 2018-03-11 MED ORDER — FLUDEOXYGLUCOSE F - 18 (FDG) INJECTION
8.8300 | Freq: Once | INTRAVENOUS | Status: AC | PRN
  Administered 2018-03-11: 8.83 via INTRAVENOUS

## 2018-03-12 ENCOUNTER — Telehealth: Payer: Self-pay | Admitting: Gynecologic Oncology

## 2018-03-12 NOTE — Telephone Encounter (Signed)
Informed patient of PET scan results.  All questions answered.  No concerns voiced. Advised she would be contacted if her surgical date could be moved to a sooner date.  Advised to call for any needs or concerns.

## 2018-03-27 ENCOUNTER — Telehealth: Payer: Self-pay | Admitting: Gynecologic Oncology

## 2018-03-27 NOTE — Telephone Encounter (Signed)
Left message for patient advising her that her surgery had been moved up to Sept 5.  Advised she would receive a phone call from pre-surgical testing about coming in for a pre-op appt.  Advised to call for any needs or concerns.

## 2018-03-28 NOTE — Patient Instructions (Signed)
Regina Salinas  03/28/2018   Your procedure is scheduled on: 04-04-18  Report to [facility] Main  Entrance  Report to admitting at      0530 AM    Call this number if you have problems the morning of surgery [phone]    Remember: FOLLOW A LIGHT DIET THE DAY BEFORE SURGERY EX: TOAST YOGURT SOUPS BROTHS AND MASHED POTATOES, AVOID: RAW FRUITS AND VEGGIES CARBONATED BEVERAGES AND BEANS   NO SOLID FOOD AFTER MIDNIGHT THE NIGHT PRIOR TO SURGERY. NOTHING BY MOUTH EXCEPT CLEAR LIQUIDS UNTIL 3 HOURS PRIOR TO [place] SURGERY. PLEASE FINISH ENSURE DRINK PER SURGEON ORDER 3 HOURS PRIOR TO SCHEDULED SURGERY TIME WHICH NEEDS TO BE COMPLETED AT ____0430 AM________.   Take these medicines the morning of surgery with A SIP OF WATER: zantac , zyrtec                                You may not have any metal on your body including hair pins and              piercings  Do not wear jewelry, make-up, lotions, powders or perfumes, deodorant             Do not wear nail polish.  Do not shave  48 hours prior to surgery.     Do not bring valuables to the hospital. [place].  Contacts, dentures or bridgework may not be worn into surgery.  Leave suitcase in the car. After surgery it may be brought to your room.                  Please read over the following fact sheets you were given: _____________________________________________________________________          [facility] - Preparing for Surgery Before surgery, you can play an important role.  Because skin is not sterile, your skin needs to be as free of germs as possible.  You can reduce the number of germs on your skin by washing with CHG (chlorahexidine gluconate) soap before surgery.  CHG is an antiseptic cleaner which kills germs and bonds with the skin to continue killing germs even after washing. Please DO NOT use if you have an allergy to CHG or antibacterial soaps.  If your  skin becomes reddened/irritated stop using the CHG and inform your nurse when you arrive at Short Stay. Do not shave (including legs and underarms) for at least 48 hours prior to the first CHG shower.  You may shave your face/neck. Please follow these instructions carefully:  1.  Shower with CHG Soap the night before surgery and the  morning of Surgery.  2.  If you choose to wash your hair, wash your hair first as usual with your  normal  shampoo.  3.  After you shampoo, rinse your hair and body thoroughly to remove the  shampoo.                           4.  Use CHG as you would any other liquid soap.  You can apply chg directly  to the skin and wash  Gently with a scrungie or clean washcloth.  5.  Apply the CHG Soap to your body ONLY FROM THE NECK DOWN.   Do not use on face/ open                           Wound or open sores. Avoid contact with eyes, ears mouth and genitals (private parts).                       Wash face,  Genitals (private parts) with your normal soap.             6.  Wash thoroughly, paying special attention to the area where your surgery  will be performed.  7.  Thoroughly rinse your body with warm water from the neck down.  8.  DO NOT shower/wash with your normal soap after using and rinsing off  the CHG Soap.                9.  Pat yourself dry with a clean towel.            10.  Wear clean pajamas.            11.  Place clean sheets on your bed the night of your first shower and do not  sleep with pets. Day of Surgery : Do not apply any lotions/deodorants the morning of surgery.  Please wear clean clothes to the hospital/surgery center.  FAILURE TO FOLLOW THESE INSTRUCTIONS MAY RESULT IN THE CANCELLATION OF YOUR SURGERY PATIENT SIGNATURE_________________________________  NURSE SIGNATURE__________________________________  ________________________________________________________________________  WHAT IS A BLOOD TRANSFUSION? Blood Transfusion  Information  A transfusion is the replacement of blood or some of its parts. Blood is made up of multiple cells which provide different functions.  Red blood cells carry oxygen and are used for blood loss replacement.  White blood cells fight against infection.  Platelets control bleeding.  Plasma helps clot blood.  Other blood products are available for specialized needs, such as hemophilia or other clotting disorders. BEFORE THE TRANSFUSION  Who gives blood for transfusions?   Healthy volunteers who are fully evaluated to make sure their blood is safe. This is blood bank blood. Transfusion therapy is the safest it has ever been in the practice of medicine. Before blood is taken from a donor, a complete history is taken to make sure that person has no history of diseases nor engages in risky social behavior (examples are intravenous drug use or sexual activity with multiple partners). The donor's travel history is screened to minimize risk of transmitting infections, such as malaria. The donated blood is tested for signs of infectious diseases, such as HIV and hepatitis. The blood is then tested to be sure it is compatible with you in order to minimize the chance of a transfusion reaction. If you or a relative donates blood, this is often done in anticipation of surgery and is not appropriate for emergency situations. It takes many days to process the donated blood. RISKS AND COMPLICATIONS Although transfusion therapy is very safe and saves many lives, the main dangers of transfusion include:   Getting an infectious disease.  Developing a transfusion reaction. This is an allergic reaction to something in the blood you were given. Every precaution is taken to prevent this. The decision to have a blood transfusion has been considered carefully by your caregiver before blood is given. Blood is not given unless the benefits outweigh  the risks. AFTER THE TRANSFUSION  Right after receiving a  blood transfusion, you will usually feel much better and more energetic. This is especially true if your red blood cells have gotten low (anemic). The transfusion raises the level of the red blood cells which carry oxygen, and this usually causes an energy increase.  The nurse administering the transfusion will monitor you carefully for complications. HOME CARE INSTRUCTIONS  No special instructions are needed after a transfusion. You may find your energy is better. Speak with your caregiver about any limitations on activity for underlying diseases you may have. SEEK MEDICAL CARE IF:   Your condition is not improving after your transfusion.  You develop redness or irritation at the intravenous (IV) site. SEEK IMMEDIATE MEDICAL CARE IF:  Any of the following symptoms occur over the next 12 hours:  Shaking chills.  You have a temperature by mouth above 102 F (38.9 C), not controlled by medicine.  Chest, back, or muscle pain.  People around you feel you are not acting correctly or are confused.  Shortness of breath or difficulty breathing.  Dizziness and fainting.  You get a rash or develop hives.  You have a decrease in urine output.  Your urine turns a dark color or changes to pink, red, or brown. Any of the following symptoms occur over the next 10 days:  You have a temperature by mouth above 102 F (38.9 C), not controlled by medicine.  Shortness of breath.  Weakness after normal activity.  The white part of the eye turns yellow (jaundice).  You have a decrease in the amount of urine or are urinating less often.  Your urine turns a dark color or changes to pink, red, or brown. Document Released: 07/14/2000 Document Revised: 10/09/2011 Document Reviewed: 03/02/2008 Exit[employer] Patient Information 2014 [place].  _______________________________________________________________________  Incentive Spirometer  An incentive spirometer is a tool that can help keep  your lungs clear and active. This tool measures how well you are filling your lungs with each breath. Taking long deep breaths may help reverse or decrease the chance of developing breathing (pulmonary) problems (especially infection) following:  A long period of time when you are unable to move or be active. BEFORE THE PROCEDURE   If the spirometer includes an indicator to show your best effort, your nurse or respiratory therapist will set it to a desired goal.  If possible, sit up straight or lean slightly forward. Try not to slouch.  Hold the incentive spirometer in an upright position. INSTRUCTIONS FOR USE  1. Sit on the edge of your bed if possible, or sit up as far as you can in bed or on a chair. 2. Hold the incentive spirometer in an upright position. 3. Breathe out normally. 4. Place the mouthpiece in your mouth and seal your lips tightly around it. 5. Breathe in slowly and as deeply as possible, raising the piston or the ball toward the top of the column. 6. Hold your breath for 3-5 seconds or for as long as possible. Allow the piston or ball to fall to the bottom of the column. 7. Remove the mouthpiece from your mouth and breathe out normally. 8. Rest for a few seconds and repeat Steps 1 through 7 at least 10 times every 1-2 hours when you are awake. Take your time and take a few normal breaths between deep breaths. 9. The spirometer may include an indicator to show your best effort. Use the indicator as a goal to work  toward during each repetition. 10. After each set of 10 deep breaths, practice coughing to be sure your lungs are clear. If you have an incision (the cut made at the time of surgery), support your incision when coughing by placing a pillow or rolled up towels firmly against it. Once you are able to get out of bed, walk around indoors and cough well. You may stop using the incentive spirometer when instructed by your caregiver.  RISKS AND COMPLICATIONS  Take your time  so you do not get dizzy or light-headed.  If you are in pain, you may need to take or ask for pain medication before doing incentive spirometry. It is harder to take a deep breath if you are having pain. AFTER USE  Rest and breathe slowly and easily.  It can be helpful to keep track of a log of your progress. Your caregiver can provide you with a simple table to help with this. If you are using the spirometer at home, follow these instructions: [place] IF:   You are having difficultly using the spirometer.  You have trouble using the spirometer as often as instructed.  Your pain medication is not giving enough relief while using the spirometer.  You develop fever of 100.5 F (38.1 C) or higher. SEEK IMMEDIATE MEDICAL CARE IF:   You cough up bloody sputum that had not been present before.  You develop fever of 102 F (38.9 C) or greater.  You develop worsening pain at or near the incision site. MAKE SURE YOU:   Understand these instructions.  Will watch your condition.  Will get help right away if you are not doing well or get worse. Document Released: 11/27/2006 Document Revised: 10/09/2011 Document Reviewed: 01/28/2007 [facility] Patient Information 2014 [place], [place].   ________________________________________________________________________

## 2018-03-29 ENCOUNTER — Encounter (HOSPITAL_COMMUNITY): Payer: Self-pay

## 2018-03-29 ENCOUNTER — Encounter (INDEPENDENT_AMBULATORY_CARE_PROVIDER_SITE_OTHER): Payer: Self-pay

## 2018-03-29 ENCOUNTER — Encounter (HOSPITAL_COMMUNITY)
Admission: RE | Admit: 2018-03-29 | Discharge: 2018-03-29 | Disposition: A | Discharge status: Home / Self Care | Payer: 59 | Source: Ambulatory Visit | Attending: Gynecologic Oncology | Admitting: Gynecologic Oncology

## 2018-03-29 ENCOUNTER — Other Ambulatory Visit: Payer: Self-pay

## 2018-03-29 DIAGNOSIS — Z01812 Encounter for preprocedural laboratory examination: Secondary | ICD-10-CM | POA: Diagnosis present

## 2018-03-29 DIAGNOSIS — C539 Malignant neoplasm of cervix uteri, unspecified: Secondary | ICD-10-CM | POA: Diagnosis not present

## 2018-03-29 LAB — CBC
HCT: 42.5 % (ref 36.0–46.0)
Hemoglobin: 13.5 g/dL (ref 12.0–15.0)
MCH: 25.8 pg — ABNORMAL LOW (ref 26.0–34.0)
MCHC: 31.8 g/dL (ref 30.0–36.0)
MCV: 81.3 fL (ref 78.0–100.0)
PLATELETS: 353 10*3/uL (ref 150–400)
RBC: 5.23 MIL/uL — AB (ref 3.87–5.11)
RDW: 14.3 % (ref 11.5–15.5)
WBC: 9.1 10*3/uL (ref 4.0–10.5)

## 2018-03-29 LAB — URINALYSIS, ROUTINE W REFLEX MICROSCOPIC
BILIRUBIN URINE: NEGATIVE
Bacteria, UA: NONE SEEN
GLUCOSE, UA: NEGATIVE mg/dL
Ketones, ur: NEGATIVE mg/dL
NITRITE: NEGATIVE
Protein, ur: NEGATIVE mg/dL
SPECIFIC GRAVITY, URINE: 1.005 (ref 1.005–1.030)
pH: 7 (ref 5.0–8.0)

## 2018-03-29 LAB — COMPREHENSIVE METABOLIC PANEL
ALT: 16 U/L (ref 0–44)
AST: 20 U/L (ref 15–41)
Albumin: 4.3 g/dL (ref 3.5–5.0)
Alkaline Phosphatase: 78 U/L (ref 38–126)
Anion gap: 10 (ref 5–15)
BUN: 16 mg/dL (ref 6–20)
CHLORIDE: 102 mmol/L (ref 98–111)
CO2: 27 mmol/L (ref 22–32)
CREATININE: 0.81 mg/dL (ref 0.44–1.00)
Calcium: 9 mg/dL (ref 8.9–10.3)
GFR calc Af Amer: >60 mL/min (ref >60)
GFR calc non Af Amer: >60 mL/min (ref >60)
Glucose, Bld: 100 mg/dL — ABNORMAL HIGH (ref 70–99)
Potassium: 5 mmol/L (ref 3.5–5.1)
Sodium: 139 mmol/L (ref 135–145)
Total Bilirubin: 1.2 mg/dL (ref 0.3–1.2)
Total Protein: 7.2 g/dL (ref 6.5–8.1)

## 2018-03-29 LAB — ABO/RH: ABO/RH(D): A POS

## 2018-04-04 ENCOUNTER — Encounter (HOSPITAL_COMMUNITY): Payer: Self-pay | Admitting: Certified Registered Nurse Anesthetist

## 2018-04-04 ENCOUNTER — Ambulatory Visit (HOSPITAL_COMMUNITY): Payer: 59 | Admitting: Certified Registered Nurse Anesthetist

## 2018-04-04 ENCOUNTER — Other Ambulatory Visit: Payer: Self-pay

## 2018-04-04 ENCOUNTER — Encounter (HOSPITAL_COMMUNITY)
Admission: AD | Disposition: A | Discharge status: Home / Self Care | Payer: Self-pay | Source: Ambulatory Visit | Attending: Gynecologic Oncology

## 2018-04-04 ENCOUNTER — Observation Stay (HOSPITAL_COMMUNITY)
Admission: AD | Admit: 2018-04-04 | Discharge: 2018-04-06 | Disposition: A | Discharge status: Home / Self Care | Payer: 59 | Source: Ambulatory Visit | Attending: Obstetrics | Admitting: Obstetrics

## 2018-04-04 DIAGNOSIS — Z8049 Family history of malignant neoplasm of other genital organs: Secondary | ICD-10-CM | POA: Diagnosis not present

## 2018-04-04 DIAGNOSIS — C539 Malignant neoplasm of cervix uteri, unspecified: Secondary | ICD-10-CM | POA: Diagnosis not present

## 2018-04-04 DIAGNOSIS — C530 Malignant neoplasm of endocervix: Principal | ICD-10-CM | POA: Insufficient documentation

## 2018-04-04 DIAGNOSIS — Z8249 Family history of ischemic heart disease and other diseases of the circulatory system: Secondary | ICD-10-CM | POA: Diagnosis not present

## 2018-04-04 DIAGNOSIS — Z8489 Family history of other specified conditions: Secondary | ICD-10-CM | POA: Diagnosis not present

## 2018-04-04 DIAGNOSIS — F17210 Nicotine dependence, cigarettes, uncomplicated: Secondary | ICD-10-CM | POA: Diagnosis not present

## 2018-04-04 DIAGNOSIS — Z79899 Other long term (current) drug therapy: Secondary | ICD-10-CM | POA: Diagnosis not present

## 2018-04-04 DIAGNOSIS — Z800 Family history of malignant neoplasm of digestive organs: Secondary | ICD-10-CM | POA: Insufficient documentation

## 2018-04-04 DIAGNOSIS — E559 Vitamin D deficiency, unspecified: Secondary | ICD-10-CM | POA: Insufficient documentation

## 2018-04-04 HISTORY — PX: ROBOTIC ASSISTED LAPAROSCOPIC HYSTERECTOMY AND SALPINGECTOMY: SHX6379

## 2018-04-04 HISTORY — PX: PELVIC LYMPH NODE DISSECTION: SHX6543

## 2018-04-04 LAB — TYPE AND SCREEN
ABO/RH(D): A POS
ANTIBODY SCREEN: NEGATIVE

## 2018-04-04 SURGERY — XI ROBOTIC ASSISTED LAPAROSCOPIC HYSTERECTOMY AND SALPINGECTOMY
Anesthesia: General | Site: Abdomen

## 2018-04-04 MED ORDER — KETOROLAC TROMETHAMINE 30 MG/ML IJ SOLN
INTRAMUSCULAR | Status: AC
  Filled 2018-04-04: qty 1

## 2018-04-04 MED ORDER — PROPOFOL 10 MG/ML IV BOLUS
INTRAVENOUS | Status: DC | PRN
  Administered 2018-04-04: 150 mg via INTRAVENOUS

## 2018-04-04 MED ORDER — CELECOXIB 200 MG PO CAPS
400.0000 mg | ORAL_CAPSULE | ORAL | Status: AC
  Administered 2018-04-04: 400 mg via ORAL
  Filled 2018-04-04: qty 2

## 2018-04-04 MED ORDER — KETAMINE HCL 10 MG/ML IJ SOLN
INTRAMUSCULAR | Status: AC
  Filled 2018-04-04: qty 1

## 2018-04-04 MED ORDER — LIDOCAINE 2% (20 MG/ML) 5 ML SYRINGE
INTRAMUSCULAR | Status: DC | PRN
  Administered 2018-04-04: 100 mg via INTRAVENOUS

## 2018-04-04 MED ORDER — LACTATED RINGERS IR SOLN
Status: DC | PRN
  Administered 2018-04-04: 1

## 2018-04-04 MED ORDER — ONDANSETRON HCL 4 MG/2ML IJ SOLN
4.0000 mg | Freq: Four times a day (QID) | INTRAMUSCULAR | Status: DC | PRN
  Administered 2018-04-04 – 2018-04-05 (×3): 4 mg via INTRAVENOUS
  Filled 2018-04-04 (×3): qty 2

## 2018-04-04 MED ORDER — SCOPOLAMINE 1 MG/3DAYS TD PT72
1.0000 | MEDICATED_PATCH | TRANSDERMAL | Status: DC
  Administered 2018-04-04: 1.5 mg via TRANSDERMAL
  Filled 2018-04-04: qty 1

## 2018-04-04 MED ORDER — ROCURONIUM BROMIDE 10 MG/ML (PF) SYRINGE
PREFILLED_SYRINGE | INTRAVENOUS | Status: AC
  Filled 2018-04-04: qty 10

## 2018-04-04 MED ORDER — ONDANSETRON HCL 4 MG PO TABS: 4.0000 mg | ORAL_TABLET | Freq: Four times a day (QID) | ORAL | Status: DC | PRN

## 2018-04-04 MED ORDER — FENTANYL CITRATE (PF) 250 MCG/5ML IJ SOLN
INTRAMUSCULAR | Status: DC | PRN
  Administered 2018-04-04 (×5): 50 ug via INTRAVENOUS

## 2018-04-04 MED ORDER — GLYCOPYRROLATE PF 0.2 MG/ML IJ SOSY
PREFILLED_SYRINGE | INTRAMUSCULAR | Status: AC
  Filled 2018-04-04: qty 1

## 2018-04-04 MED ORDER — LIDOCAINE 2% (20 MG/ML) 5 ML SYRINGE
INTRAMUSCULAR | Status: DC | PRN
  Administered 2018-04-04: 1.5 mg/kg/h via INTRAVENOUS

## 2018-04-04 MED ORDER — ACETAMINOPHEN 500 MG PO TABS
1000.0000 mg | ORAL_TABLET | Freq: Four times a day (QID) | ORAL | Status: DC
  Administered 2018-04-04 – 2018-04-06 (×8): 1000 mg via ORAL
  Filled 2018-04-04 (×8): qty 2

## 2018-04-04 MED ORDER — GABAPENTIN 300 MG PO CAPS
300.0000 mg | ORAL_CAPSULE | ORAL | Status: AC
  Administered 2018-04-04: 300 mg via ORAL
  Filled 2018-04-04: qty 1

## 2018-04-04 MED ORDER — PROMETHAZINE HCL 25 MG/ML IJ SOLN: 6.2500 mg | INTRAMUSCULAR | Status: DC | PRN

## 2018-04-04 MED ORDER — ROCURONIUM BROMIDE 50 MG/5ML IV SOSY
PREFILLED_SYRINGE | INTRAVENOUS | Status: DC | PRN
  Administered 2018-04-04 (×2): 10 mg via INTRAVENOUS
  Administered 2018-04-04: 50 mg via INTRAVENOUS
  Administered 2018-04-04 (×2): 10 mg via INTRAVENOUS

## 2018-04-04 MED ORDER — KETAMINE HCL 10 MG/ML IJ SOLN
INTRAMUSCULAR | Status: DC | PRN
  Administered 2018-04-04 (×2): 25 mg via INTRAVENOUS

## 2018-04-04 MED ORDER — GABAPENTIN 300 MG PO CAPS
600.0000 mg | ORAL_CAPSULE | Freq: Every day | ORAL | Status: AC
  Administered 2018-04-04: 600 mg via ORAL
  Filled 2018-04-04: qty 2

## 2018-04-04 MED ORDER — ENOXAPARIN SODIUM 40 MG/0.4ML ~~LOC~~ SOLN
40.0000 mg | SUBCUTANEOUS | Status: DC
  Administered 2018-04-05 – 2018-04-06 (×2): 40 mg via SUBCUTANEOUS
  Filled 2018-04-04 (×2): qty 0.4

## 2018-04-04 MED ORDER — SUCCINYLCHOLINE CHLORIDE 200 MG/10ML IV SOSY
PREFILLED_SYRINGE | INTRAVENOUS | Status: AC
  Filled 2018-04-04: qty 10

## 2018-04-04 MED ORDER — KETOROLAC TROMETHAMINE 30 MG/ML IJ SOLN
30.0000 mg | Freq: Once | INTRAMUSCULAR | Status: AC | PRN
  Administered 2018-04-04: 30 mg via INTRAVENOUS

## 2018-04-04 MED ORDER — DEXAMETHASONE SODIUM PHOSPHATE 10 MG/ML IJ SOLN
INTRAMUSCULAR | Status: AC
  Filled 2018-04-04: qty 1

## 2018-04-04 MED ORDER — ENOXAPARIN SODIUM 40 MG/0.4ML ~~LOC~~ SOLN
40.0000 mg | SUBCUTANEOUS | Status: AC
  Administered 2018-04-04: 40 mg via SUBCUTANEOUS
  Filled 2018-04-04: qty 0.4

## 2018-04-04 MED ORDER — SUGAMMADEX SODIUM 200 MG/2ML IV SOLN
INTRAVENOUS | Status: AC
  Filled 2018-04-04: qty 2

## 2018-04-04 MED ORDER — KETOROLAC TROMETHAMINE 30 MG/ML IJ SOLN: 15.0000 mg | Freq: Once | INTRAMUSCULAR | Status: DC

## 2018-04-04 MED ORDER — ACETAMINOPHEN 500 MG PO TABS
1000.0000 mg | ORAL_TABLET | ORAL | Status: AC
  Administered 2018-04-04: 1000 mg via ORAL
  Filled 2018-04-04: qty 2

## 2018-04-04 MED ORDER — GLYCOPYRROLATE PF 0.2 MG/ML IJ SOSY
PREFILLED_SYRINGE | INTRAMUSCULAR | Status: DC | PRN
  Administered 2018-04-04: .2 mg via INTRAVENOUS

## 2018-04-04 MED ORDER — HYDROMORPHONE HCL 1 MG/ML IJ SOLN
0.2500 mg | INTRAMUSCULAR | Status: DC | PRN
  Administered 2018-04-04 (×3): 0.5 mg via INTRAVENOUS

## 2018-04-04 MED ORDER — ONDANSETRON HCL 4 MG/2ML IJ SOLN
INTRAMUSCULAR | Status: AC
  Filled 2018-04-04: qty 2

## 2018-04-04 MED ORDER — STERILE WATER FOR INJECTION IJ SOLN
INTRAMUSCULAR | Status: AC
  Filled 2018-04-04: qty 10

## 2018-04-04 MED ORDER — ONDANSETRON HCL 4 MG/2ML IJ SOLN
INTRAMUSCULAR | Status: DC | PRN
  Administered 2018-04-04 (×2): 4 mg via INTRAVENOUS

## 2018-04-04 MED ORDER — HYDROMORPHONE HCL 1 MG/ML IJ SOLN
0.5000 mg | INTRAMUSCULAR | Status: DC | PRN
  Administered 2018-04-04 – 2018-04-06 (×7): 0.5 mg via INTRAVENOUS
  Filled 2018-04-04 (×7): qty 0.5

## 2018-04-04 MED ORDER — FENTANYL CITRATE (PF) 250 MCG/5ML IJ SOLN
INTRAMUSCULAR | Status: AC
  Filled 2018-04-04: qty 5

## 2018-04-04 MED ORDER — KETOROLAC TROMETHAMINE 15 MG/ML IJ SOLN
15.0000 mg | Freq: Four times a day (QID) | INTRAMUSCULAR | Status: AC
  Administered 2018-04-04: 15 mg via INTRAVENOUS
  Filled 2018-04-04: qty 1

## 2018-04-04 MED ORDER — LIDOCAINE HCL 2 % IJ SOLN
INTRAMUSCULAR | Status: AC
  Filled 2018-04-04: qty 20

## 2018-04-04 MED ORDER — SUGAMMADEX SODIUM 200 MG/2ML IV SOLN
INTRAVENOUS | Status: DC | PRN
  Administered 2018-04-04: 200 mg via INTRAVENOUS

## 2018-04-04 MED ORDER — CEFAZOLIN SODIUM-DEXTROSE 2-4 GM/100ML-% IV SOLN
2.0000 g | INTRAVENOUS | Status: AC
  Administered 2018-04-04: 2 g via INTRAVENOUS
  Filled 2018-04-04: qty 100

## 2018-04-04 MED ORDER — LIDOCAINE 2% (20 MG/ML) 5 ML SYRINGE
INTRAMUSCULAR | Status: AC
  Filled 2018-04-04: qty 5

## 2018-04-04 MED ORDER — MEPERIDINE HCL 50 MG/ML IJ SOLN
6.2500 mg | INTRAMUSCULAR | Status: DC | PRN
  Administered 2018-04-04 (×2): 12.5 mg via INTRAVENOUS

## 2018-04-04 MED ORDER — DEXAMETHASONE SODIUM PHOSPHATE 4 MG/ML IJ SOLN: 4.0000 mg | INTRAMUSCULAR | Status: DC

## 2018-04-04 MED ORDER — MIDAZOLAM HCL 5 MG/5ML IJ SOLN
INTRAMUSCULAR | Status: DC | PRN
  Administered 2018-04-04: 2 mg via INTRAVENOUS

## 2018-04-04 MED ORDER — KCL IN DEXTROSE-NACL 20-5-0.45 MEQ/L-%-% IV SOLN
INTRAVENOUS | Status: DC
  Administered 2018-04-04 – 2018-04-06 (×3): via INTRAVENOUS
  Filled 2018-04-04 (×3): qty 1000

## 2018-04-04 MED ORDER — FAMOTIDINE 20 MG PO TABS
20.0000 mg | ORAL_TABLET | Freq: Two times a day (BID) | ORAL | Status: DC
  Administered 2018-04-04 – 2018-04-06 (×4): 20 mg via ORAL
  Filled 2018-04-04 (×4): qty 1

## 2018-04-04 MED ORDER — HYDROMORPHONE HCL 1 MG/ML IJ SOLN
INTRAMUSCULAR | Status: AC
  Filled 2018-04-04: qty 2

## 2018-04-04 MED ORDER — IBUPROFEN 800 MG PO TABS
800.0000 mg | ORAL_TABLET | Freq: Four times a day (QID) | ORAL | Status: DC
  Administered 2018-04-05 – 2018-04-06 (×4): 800 mg via ORAL
  Filled 2018-04-04 (×4): qty 1

## 2018-04-04 MED ORDER — SENNOSIDES-DOCUSATE SODIUM 8.6-50 MG PO TABS
2.0000 | ORAL_TABLET | Freq: Every day | ORAL | Status: DC
  Administered 2018-04-04 – 2018-04-05 (×2): 2 via ORAL
  Filled 2018-04-04 (×3): qty 2

## 2018-04-04 MED ORDER — MIDAZOLAM HCL 2 MG/2ML IJ SOLN
INTRAMUSCULAR | Status: AC
  Filled 2018-04-04: qty 2

## 2018-04-04 MED ORDER — PROPOFOL 10 MG/ML IV BOLUS
INTRAVENOUS | Status: AC
  Filled 2018-04-04: qty 20

## 2018-04-04 MED ORDER — SUCCINYLCHOLINE CHLORIDE 200 MG/10ML IV SOSY
PREFILLED_SYRINGE | INTRAVENOUS | Status: DC | PRN
  Administered 2018-04-04: 100 mg via INTRAVENOUS

## 2018-04-04 MED ORDER — OXYCODONE HCL 5 MG PO TABS
5.0000 mg | ORAL_TABLET | ORAL | Status: DC | PRN
  Administered 2018-04-05: 5 mg via ORAL
  Filled 2018-04-04: qty 1

## 2018-04-04 MED ORDER — MEPERIDINE HCL 50 MG/ML IJ SOLN
INTRAMUSCULAR | Status: AC
  Filled 2018-04-04: qty 1

## 2018-04-04 MED ORDER — STERILE WATER FOR IRRIGATION IR SOLN
Status: DC | PRN
  Administered 2018-04-04: 1000 mL

## 2018-04-04 MED ORDER — PROCHLORPERAZINE MALEATE 10 MG PO TABS: 5.0000 mg | ORAL_TABLET | Freq: Four times a day (QID) | ORAL | Status: DC | PRN

## 2018-04-04 MED ORDER — DEXAMETHASONE SODIUM PHOSPHATE 10 MG/ML IJ SOLN
INTRAMUSCULAR | Status: DC | PRN
  Administered 2018-04-04: 5 mg via INTRAVENOUS

## 2018-04-04 MED ORDER — LACTATED RINGERS IV SOLN
INTRAVENOUS | Status: DC
  Administered 2018-04-04 (×2): via INTRAVENOUS
  Administered 2018-04-04: 1000 mL via INTRAVENOUS

## 2018-04-04 SURGICAL SUPPLY — 47 items
APPLICATOR SURGIFLO ENDO (HEMOSTASIS) IMPLANT
BAG LAPAROSCOPIC 12 15 PORT 16 (BASKET) IMPLANT
BAG RETRIEVAL 12/15 (BASKET)
COVER BACK TABLE 60X90IN (DRAPES) ×3 IMPLANT
COVER TIP SHEARS 8 DVNC (MISCELLANEOUS) ×2 IMPLANT
COVER TIP SHEARS 8MM DA VINCI (MISCELLANEOUS) ×1
DERMABOND ADVANCED (GAUZE/BANDAGES/DRESSINGS) ×1
DERMABOND ADVANCED .7 DNX12 (GAUZE/BANDAGES/DRESSINGS) ×2 IMPLANT
DRAPE ARM DVNC X/XI (DISPOSABLE) ×8 IMPLANT
DRAPE COLUMN DVNC XI (DISPOSABLE) ×2 IMPLANT
DRAPE DA VINCI XI ARM (DISPOSABLE) ×4
DRAPE DA VINCI XI COLUMN (DISPOSABLE) ×1
DRAPE SHEET LG 3/4 BI-LAMINATE (DRAPES) ×3 IMPLANT
DRAPE SURG IRRIG POUCH 19X23 (DRAPES) ×3 IMPLANT
ELECT REM PT RETURN 15FT ADLT (MISCELLANEOUS) ×3 IMPLANT
GLOVE BIO SURGEON STRL SZ 6 (GLOVE) ×12 IMPLANT
GLOVE BIO SURGEON STRL SZ 6.5 (GLOVE) ×9 IMPLANT
GOWN STRL REUS W/ TWL LRG LVL3 (GOWN DISPOSABLE) ×4 IMPLANT
GOWN STRL REUS W/TWL LRG LVL3 (GOWN DISPOSABLE) ×2
HOLDER FOLEY CATH W/STRAP (MISCELLANEOUS) ×3 IMPLANT
IRRIG SUCT STRYKERFLOW 2 WTIP (MISCELLANEOUS) ×3
IRRIGATION SUCT STRKRFLW 2 WTP (MISCELLANEOUS) ×2 IMPLANT
KIT PROCEDURE DA VINCI SI (MISCELLANEOUS)
KIT PROCEDURE DVNC SI (MISCELLANEOUS) IMPLANT
MANIPULATOR UTERINE 4.5 ZUMI (MISCELLANEOUS) ×3 IMPLANT
NEEDLE SPNL 18GX3.5 QUINCKE PK (NEEDLE) IMPLANT
OBTURATOR OPTICAL STANDARD 8MM (TROCAR) ×1
OBTURATOR OPTICAL STND 8 DVNC (TROCAR) ×2
OBTURATOR OPTICALSTD 8 DVNC (TROCAR) ×2 IMPLANT
PACK ROBOT GYN CUSTOM WL (TRAY / TRAY PROCEDURE) ×3 IMPLANT
PAD POSITIONING PINK XL (MISCELLANEOUS) ×3 IMPLANT
PORT ACCESS TROCAR AIRSEAL 12 (TROCAR) ×2 IMPLANT
PORT ACCESS TROCAR AIRSEAL 5M (TROCAR) ×1
POUCH SPECIMEN RETRIEVAL 10MM (ENDOMECHANICALS) ×6 IMPLANT
SEAL CANN UNIV 5-8 DVNC XI (MISCELLANEOUS) ×8 IMPLANT
SEAL XI 5MM-8MM UNIVERSAL (MISCELLANEOUS) ×4
SET TRI-LUMEN FLTR TB AIRSEAL (TUBING) ×3 IMPLANT
SURGIFLO W/THROMBIN 8M KIT (HEMOSTASIS) IMPLANT
SUT VIC AB 0 CT1 27 (SUTURE)
SUT VIC AB 0 CT1 27XBRD ANTBC (SUTURE) IMPLANT
SUT VIC AB 0 CT1 36 (SUTURE) ×3 IMPLANT
SYR 10ML LL (SYRINGE) IMPLANT
TOWEL OR NON WOVEN STRL DISP B (DISPOSABLE) ×3 IMPLANT
TRAP SPECIMEN MUCOUS 40CC (MISCELLANEOUS) IMPLANT
TRAY FOLEY MTR SLVR 16FR STAT (SET/KITS/TRAYS/PACK) ×3 IMPLANT
UNDERPAD 30X30 (UNDERPADS AND DIAPERS) ×3 IMPLANT
WATER STERILE IRR 1000ML POUR (IV SOLUTION) ×3 IMPLANT

## 2018-04-04 NOTE — Anesthesia Postprocedure Evaluation (Signed)
Anesthesia Post Note  Patient: GRETNA BERGIN  Procedure(s) Performed: XI ROBOTIC ASSISTED RADICAL HYSTERECTOMY AND SALPINGECTOMY (Bilateral Abdomen) BILATERAL PELVIC LYMPHADENECTOMY (N/A Abdomen)     Patient location during evaluation: PACU Anesthesia Type: General Level of consciousness: awake and alert Pain management: pain level controlled Vital Signs Assessment: post-procedure vital signs reviewed and stable Respiratory status: spontaneous breathing, nonlabored ventilation, respiratory function stable and patient connected to nasal cannula oxygen Cardiovascular status: blood pressure returned to baseline and stable Postop Assessment: no apparent nausea or vomiting Anesthetic complications: no    Last Vitals:  Vitals:   04/04/18 1327 04/04/18 1500  BP: 116/72 120/64  Pulse: 69 (!) 53  Resp:    Temp:  36.6 C  SpO2: 99% 100%    Last Pain:  Vitals:   04/04/18 1557  TempSrc:   PainSc: 5                  Ilyanna Baillargeon S

## 2018-04-04 NOTE — Anesthesia Procedure Notes (Addendum)
Procedure Name: Intubation Date/Time: 04/04/2018 7:38 AM Performed by: Maxwell Caul, CRNA Pre-anesthesia Checklist: Patient identified, Emergency Drugs available, Suction available and Patient being monitored Patient Re-evaluated:Patient Re-evaluated prior to induction Oxygen Delivery Method: Circle system utilized Preoxygenation: Pre-oxygenation with 100% oxygen Induction Type: IV induction Ventilation: Mask ventilation without difficulty Laryngoscope Size: Mac and 3 Grade View: Grade II Tube type: Oral Tube size: 7.5 mm Number of attempts: 1 Airway Equipment and Method: Stylet Placement Confirmation: ETT inserted through vocal cords under direct vision,  positive ETCO2 and breath sounds checked- equal and bilateral Secured at: 21 cm Tube secured with: Tape Dental Injury: Teeth and Oropharynx as per pre-operative assessment

## 2018-04-04 NOTE — Transfer of Care (Signed)
Immediate Anesthesia Transfer of Care Note  Patient: Regina Salinas  Procedure(s) Performed: XI ROBOTIC ASSISTED RADICAL HYSTERECTOMY AND SALPINGECTOMY (Bilateral Abdomen) BILATERAL PELVIC LYMPHADENECTOMY (N/A Abdomen)  Patient Location: PACU  Anesthesia Type:General  Level of Consciousness: awake, alert  and oriented  Airway & Oxygen Therapy: Patient Spontanous Breathing and Patient connected to face mask oxygen  Post-op Assessment: Report given to RN and Post -op Vital signs reviewed and stable  Post vital signs: Reviewed and stable  Last Vitals:  Vitals Value Taken Time  BP 133/78 04/04/2018 11:33 AM  Temp    Pulse 78 04/04/2018 11:39 AM  Resp 18 04/04/2018 11:39 AM  SpO2 100 % 04/04/2018 11:39 AM  Vitals shown include unvalidated device data.  Last Pain:  Vitals:   04/04/18 0540  TempSrc: Oral      Patients Stated Pain Goal: 4 (20/60/15 6153)  Complications: No apparent anesthesia complications

## 2018-04-04 NOTE — Progress Notes (Signed)
Dilaudid 0.5 mg given for a total of 2 mg in pacu.

## 2018-04-04 NOTE — Interval H&P Note (Signed)
History and Physical Interval Note:  04/04/2018 7:18 AM  Regina Salinas  has presented today for surgery, with the diagnosis of CERVICAL CANCER  The various methods of treatment have been discussed with the patient and family. After consideration of risks, benefits and other options for treatment, the patient has consented to  Procedure(s): XI ROBOTIC ASSISTED RADICAL HYSTERECTOMY AND SALPINGECTOMY (N/A) BILATERAL PELVIC LYMPHADENECTOMY (N/A) as a surgical intervention .  The patient's history has been reviewed, patient examined, no change in status, stable for surgery.  I have reviewed the patient's chart and labs.  Questions were answered to the patient's satisfaction.     [name]

## 2018-04-04 NOTE — Op Note (Signed)
OPERATIVE NOTE 04/04/18  Surgeon: Donaciano Eva   Assistants: Lahoma Crocker (an MD assistant was necessary for tissue manipulation, management of robotic instrumentation, retraction and positioning due to the complexity of the case and hospital policies).   Anesthesia: General endotracheal anesthesia  ASA Class: 3   Pre-operative Diagnosis: Stage IB2 cervical cancer  Post-operative Diagnosis: same  Operation: Robotic-assisted type III radical laparoscopic hysterectomy with bilateral salpingectomy and bilateral pelvic lymphadenectomy  Surgeon: Donaciano Eva  Assistant Surgeon: Lahoma Crocker MD  Anesthesia: GET  Urine Output: 500cc  Operative Findings:  : grossly normal ectocervix, bulky endocervix, no palpable parametrial disease, normal ovaries bilaterally, surgically interrupted tubes. Normal lymph nodes (left exernal iliac slightly bulky - frozen section benign).   Estimated Blood Loss:  less than 100 mL      Total IV Fluids: 1,000 ml         Specimens: left external iliac lymph nodes, right pelvic lymph nodes, left pelvic lymph nodes, uterus with cervix, bilateral fallopian tubes, upper vagina. New true anterior vaginal margin with marking stitch at midline anterior distal vaginal margin.          Complications:  None; patient tolerated the procedure well.         Disposition: PACU - hemodynamically stable.  Procedure Details  The patient was seen in the Holding Room. The risks, benefits, complications, treatment options, and expected outcomes were discussed with the patient.  The patient concurred with the proposed plan, giving informed consent.  The site of surgery properly noted/marked. The patient was identified as Regina Salinas and the procedure verified as a Robotic-assisted radical hysterectomy with bilateral salpingectomy and bilateral pelvic lymphadenectomy. A Time Out was held and the above information confirmed.  After induction of anesthesia,  the patient was draped and prepped in the usual sterile manner. Pt was placed in supine position after anesthesia and draped and prepped in the usual sterile manner. The abdominal drape was placed after the CholoraPrep had been allowed to dry for 3 minutes.  Her arms were tucked to her side with all appropriate precautions. The patient was placed in the semi-lithotomy position in [place].  The perineum was prepped with Betadine.  Foley catheter was placed.  An EEA sizer (large size) was placed vaginally to define the vaginal fornices.  A second time-out was performed.  OG tube placement was confirmed and to suction.    Procedure:  The patient was brought to the operating room where general anesthesia was administered with no complications.  The patient was placed in the dorsal lithotomy position in padded Allen stirrups.  The arms were tucked at the sides with gel pads protecting the elbows and foam protecting the hands. The patient was then prepped.  A Foley was placed to gravity. An EEA sizer was placed in the vaginal fornices.  The patient was then draped in the normal manner.  Next, a 5 mm skin incision was made 1 cm below the subcostal margin in the midclavicular line.  The 5 mm Optiview port and scope was used for direct entry.  Opening pressure was under 10 mm CO2.  The abdomen was insufflated and the findings were noted as above.   At this point and all points during the procedure, the patient's intra-abdominal pressure did not exceed 15 mmHg. Next, an 8 mm skin incision was made at the umbilicus and a right and left port was placed about 10 cm lateral to the robot port on the right and left  side.  A fourth arm was placed in the left lower quadrant 2 cm above and superior and medial to the anterior superior iliac spine.  All ports were placed under direct visualization.  The patient was placed in steep Trendelenburg.  Bowel was away into the upper abdomen.  The robot was docked in the normal  manner.  The right retroperitoneum in the pelvis was opened parallel to the IP ligament. The right paravesical space was developed with monopolar and sharp dissection. It was held open with tension on the median umbilical ligament with the forth arm. The pararectal space was opened with blunt and sharp dissection to mobilize the ureter off of the medial surface of the internal iliac artery. The medial leaf of the broad ligament containing the ureter was held medially (opening the pararectal space) by the assistant's grasper. The right pelvic lymphadenectomy was performed by skeletonizing the internal iliac artery at the bifurcation with the external iliac artery. The obturator nerve was identified in the base of lateral paravesical space. The ureter was mobilized medially off of the dissection by developing the pararectal space. The genitofemoral nerve was identified, skeletonized and mobilized laterally off of the external iliac artery. An enbloc resection of lymph nodes was performed within the following boundaries: the mid portion of the common iliac proximally, the circumflex iliac vein distally, the obturator nerve posteriorally, the genitofemoral nerve laterally. The nodal basin (including obturator space) were confirmed to be empty of nodes and hemostatic. The nodes were placed in an endocatch bag and retrieved at the end of the procedure vaginally.  The same procedure with the same steps was performed on the patient's left side. The left retroperitoneum in the pelvis was opened parallel to the IP ligament. The left paravesical space was developed with monopolar and sharp dissection. It was held open with tension on the median umbilical ligament with the forth arm. The pararectal space was opened with blunt and sharp dissection to mobilize the ureter off of the medial surface of the internal iliac artery. The medial leaf of the broad ligament containing the ureter was held medially (opening the pararectal  space) by the assistant's grasper. A slightly suspicious node was identified on the left external iliac vein which was removed and sent for frozen section which revealed benign lymph node tissues. The left pelvic lymphadenectomy was performed by skeletonizing the internal iliac artery at the bifurcation with the external iliac artery. The obturator nerve was identified in the base of lateral paravesical space. The ureter was mobilized medially off of the dissection by developing the pararectal space. The genitofemoral nerve was identified, skeletonized and mobilized laterally off of the external iliac artery. An enbloc resection of lymph nodes was performed within the following boundaries: the mid portion of the common iliac proximally, the circumflex iliac vein distally, the obturator nerve posteriorally, the genitofemoral nerve laterally. The nodal basin (including obturator space) were confirmed to be empty of nodes and hemostatic. The nodes were placed in an endocatch bag and retrieved at the end of the procedure vaginally.  The radical hysterectomy was begun by first skeletonizing the right uterine artery at its origin from the internal iliac artery by skeletonizing it 360 degrees and defining the parauterine web between the paravesical and pararectal spaces. The right ureter was skeletonized off of its attachments to the broad ligament and mobilized laterally. Using meticulous blunt and sparing monopolar dissection in short controlled bursts, the right ureter was untunnelled from under the right uterine artery. There was hypervascularity  and "stickiness" to the tissues in the tunnel and anterior bladder pillar, likely secondary to her history of a cone biopsy. The anterior vessicouterine ligament was developed and the bladder flap was taken down to below the level of the tumor and below the rounded portion of the EEA sizer. This then definied the anterior bladder pillar. The uterine artery was bipolar  fulgarated to seal it, then transected. The uterine vein was also sealed and resected. The lateral boundary of the parametrium was taken down with biploar and monopolar dissection to the level of the deep vaginal vein. The uterine vessels were retracted superior and medially over the ureter on the right. The ureter was untunnelled through to its entry into the bladder with meticulous sharp dissection and short bursts of monopolar energy. The ureter was dissected off of its attachments to the anterior vagina. The anterior bladder pillar was skeletonized and sealed with bipolar energy while the ureter was deflected distally. The posterior bladder pillar was also dissected with monopolar scissors from the upper vagina.  The rectovaginal septum was entered posteriorally with sharp dissection and the rectum was dissected off of the vagina. A window was created in the broad ligament with care to mobilize the ureter laterally. This skeletonized the utero-ovarian ligament which was sealed with bipolar energy and transected.The right uterosacral ligament was transected with bipolar and monopolar energy 1/2 to 2/3rds of the way towards the uterosacral ligament insertion into the sacrum. In doing so meticulous attention was made to identify and wherever possible, spare the hypogastric nerves. The right paravaginal tissues were tubularized around the vagina on the right at the inferior boundary of the dissection.  The left radical hysterectomy was then performed by first skeletonizing the left uterine artery at its origin from the internal iliac artery by skeletonizing it 360 degrees and defining the parauterine web between the paravesical and pararectal spaces. The left ureter was skeletonized off of its attachments to the broad ligament and mobilized laterally. Using meticulous blunt and sparing monopolar dissection in short controlled bursts, the left ureter was untunnelled from under the left uterine artery. Even more  so on the left there was hypervascularity and stickiness of the tissues here and great care was taken to avoid discharging electrosurgery on the ureter. The left anterior vessicouterine ligament was developed and the bladder flap was taken down to below the level of the tumor and below the rounded portion of the EEA sizer. The bladder was very adherent to the cervix anteriorally making this dissection challenging. This then definied the anterior bladder pillar. The uterine artery was bipolar fulgarated to seal it, then transected. The uterine vein was also sealed and resected. The lateral boundary of the parametrium was taken down with biploar and monopolar dissection to the level of the deep vaginal vein. The uterine vessels were retracted superior and medially over the ureter on the right. The ureter was untunnelled through to its entry into the bladder with meticulous sharp dissection and short bursts of monopolar energy. The ureter was dissected off of its attachments to the anterior vagina. The anterior bladder pillar was skeletonized and sealed with bipolar energy while the ureter was deflected distally. The posterior bladder pillar was also dissected with monopolar scissors from the upper vagina.  The rectovaginal septum was entered posteriorally with sharp dissection and the rectum was dissected off of the vagina. A window was created in the broad ligament with care to mobilize the ureter laterally. This skeletonized the utero-ovarian ligament which was sealed with  bipolar energy and transected.The left uterosacral ligament was transected with bipolar and monopolar energy 1/2 to 2/3rds of the way towards the uterosacral ligament insertion into the sacrum. In doing so meticulous attention was made to identify and wherever possible, spare the hypogastric nerves. The left paravaginal tissues were tubularized around the vagina on the left at the inferior boundary of the dissection.  The colpotomy was made  and the uterus, cervix, bilateral ovaries and tubes were amputated and delivered through the vagina.  Pedicles were inspected and excellent hemostasis was achieved.  The specimen was inspected and it was felt that the anterior vaginal margin was inadquate (1cm) and therefore the bladder was taken down inferiorally further and an additional 1cm anterior vaginal margin specimen was taken. This was marked at the new true distal anterior vaginal margin with a 0-vicryl suture.   The colpotomy at the vaginal cuff was closed with two 0-Vicryls on a CT1 needle in a running manner.  Irrigation was used and excellent hemostasis was achieved.  At this point in the procedure was completed.  Robotic instruments were removed under direct visulaization.  The robot was undocked. The 10 mm ports were closed with Vicryl on a UR-5 needle and the fascia was closed with 0 Vicryl on a UR-5 needle.  The skin was closed with 4-0 Vicryl in a subcuticular manner.  Dermabond was applied.  Sponge, lap and needle counts correct x 2.  The patient was taken to the recovery room in stable condition.  The vagina was swabbed with  minimal bleeding noted.   All instrument and needle counts were correct x  3.   The patient was transferred to the recovery room in a stable condition.  Donaciano Eva, MD

## 2018-04-04 NOTE — Anesthesia Preprocedure Evaluation (Signed)
Anesthesia Evaluation  Patient identified by MRN, date of birth, ID band Patient awake    Reviewed: Allergy & Precautions, NPO status , Patient's Chart, lab work & pertinent test results  History of Anesthesia Complications (+) PONV  Airway Mallampati: II  TM Distance: >3 FB Neck ROM: Full    Dental no notable dental hx.    Pulmonary Current Smoker,    Pulmonary exam normal breath sounds clear to auscultation       Cardiovascular negative cardio ROS Normal cardiovascular exam Rhythm:Regular Rate:Normal     Neuro/Psych negative neurological ROS  negative psych ROS   GI/Hepatic negative GI ROS, Neg liver ROS,   Endo/Other  negative endocrine ROS  Renal/GU negative Renal ROS  negative genitourinary   Musculoskeletal negative musculoskeletal ROS (+)   Abdominal   Peds negative pediatric ROS (+)  Hematology negative hematology ROS (+)   Anesthesia Other Findings   Reproductive/Obstetrics negative OB ROS                             Anesthesia Physical Anesthesia Plan  ASA: II  Anesthesia Plan: General   Post-op Pain Management:    Induction: Intravenous  PONV Risk Score and Plan: 4 or greater and Ondansetron, Dexamethasone, Midazolam, Scopolamine patch - Pre-op and Treatment may vary due to age or medical condition  Airway Management Planned: Oral ETT  Additional Equipment:   Intra-op Plan:   Post-operative Plan: Extubation in OR  Informed Consent: I have reviewed the patients History and Physical, chart, labs and discussed the procedure including the risks, benefits and alternatives for the proposed anesthesia with the patient or authorized representative who has indicated his/her understanding and acceptance.   Dental advisory given  Plan Discussed with: CRNA and Surgeon  Anesthesia Plan Comments:         Anesthesia Quick Evaluation

## 2018-04-05 ENCOUNTER — Encounter (HOSPITAL_COMMUNITY): Payer: Self-pay | Admitting: Gynecologic Oncology

## 2018-04-05 DIAGNOSIS — C530 Malignant neoplasm of endocervix: Secondary | ICD-10-CM | POA: Diagnosis not present

## 2018-04-05 LAB — BASIC METABOLIC PANEL
ANION GAP: 6 (ref 5–15)
BUN: 9 mg/dL (ref 6–20)
CHLORIDE: 105 mmol/L (ref 98–111)
CO2: 28 mmol/L (ref 22–32)
Calcium: 8.3 mg/dL — ABNORMAL LOW (ref 8.9–10.3)
Creatinine, Ser: 0.76 mg/dL (ref 0.44–1.00)
GFR calc non Af Amer: >60 mL/min (ref >60)
Glucose, Bld: 117 mg/dL — ABNORMAL HIGH (ref 70–99)
Potassium: 4.1 mmol/L (ref 3.5–5.1)
Sodium: 139 mmol/L (ref 135–145)

## 2018-04-05 LAB — CBC
HEMATOCRIT: 36.1 % (ref 36.0–46.0)
HEMOGLOBIN: 11.8 g/dL — AB (ref 12.0–15.0)
MCH: 26.6 pg (ref 26.0–34.0)
MCHC: 32.7 g/dL (ref 30.0–36.0)
MCV: 81.3 fL (ref 78.0–100.0)
Platelets: 270 10*3/uL (ref 150–400)
RBC: 4.44 MIL/uL (ref 3.87–5.11)
RDW: 14.4 % (ref 11.5–15.5)
WBC: 13.7 10*3/uL — AB (ref 4.0–10.5)

## 2018-04-05 MED ORDER — ONDANSETRON HCL 4 MG PO TABS: 4.0000 mg | ORAL_TABLET | ORAL | Status: DC | PRN

## 2018-04-05 MED ORDER — ONDANSETRON HCL 4 MG/2ML IJ SOLN: 4.0000 mg | INTRAMUSCULAR | Status: DC | PRN

## 2018-04-05 NOTE — Progress Notes (Addendum)
1 Day Post-Op Procedure(s) (LRB): XI ROBOTIC ASSISTED RADICAL HYSTERECTOMY AND SALPINGECTOMY (Bilateral) BILATERAL PELVIC LYMPHADENECTOMY (N/A)  Subjective: Patient reports nausea since surgery.  No episodes of emesis.  States she ambulated last pm but became very dizzy and has not been up since.  She has not had any solid food since surgery.  Just ordered oatmeal and yogurt.  States she has had nausea in the past post-op with previous surgeries.  Minimal abdominal pain reported but stating she has lower back pain, more on the right. Passing small amount of flatus.  Denies chest pain, dyspnea, having a BM.  Asking if she will receive a prescription for nausea when she goes home.  No other concerns voiced.   Objective: Vital signs in last 24 hours: Temp:  [97.5 F (36.4 C)-98.6 F (37 C)] 97.7 F (36.5 C) (09/06 0902) Pulse Rate:  [52-70] 70 (09/06 0902) Resp:  [10-18] 16 (09/06 0902) BP: (93-151)/(50-104) 108/50 (09/06 0902) SpO2:  [95 %-100 %] 97 % (09/06 0902) Last BM Date: 04/03/18  Intake/Output from previous day: 09/05 0701 - 09/06 0700 In: 2761.3 [P.O.:120; I.V.:2641.3] Out: 2350 [Urine:2250; Blood:100]  Physical Examination: General: alert, cooperative and no distress Resp: clear to auscultation bilaterally Cardio: regular rate and rhythm, S1, S2 normal, no murmur, click, rub or gallop GI: soft, non-tender; bowel sounds normal; no masses,  no organomegaly and incision: lap sites to the abdomen with dermabond without erythema or drainage Extremities: extremities normal, atraumatic, no cyanosis or edema  Foley to straight drain with clear, yellow urine  Labs: WBC/Hgb/Hct/Plts:  13.7/11.8/36.1/270 (09/06 0434) BUN/Cr/glu/ALT/AST/amyl/lip:  9/0.76/--/--/--/--/-- (09/06 0434)  Assessment: 52 y.o. s/p Procedure(s): XI ROBOTIC ASSISTED RADICAL HYSTERECTOMY AND SALPINGECTOMY BILATERAL PELVIC LYMPHADENECTOMY: stable Pain:  Pain is well-controlled on PRN medications.  Heme: Hgb  11.8 and Hct 36.1 this am.  Appropriate compared with pre-op labs.  CV: BP and HR stable.  Continue to monitor with vital signs.  GI:  Tolerating po: minimal amount due to nausea.  Scopolamine patch in place and just received IV zofran.   GU:  Foley in place with clear, yellow urine.  Adequate output reported.  FEN: Appropriate this am.  Prophylaxis: pharmacologic prophylaxis (with any of the following: enoxaparin (Lovenox) 40mg  SQ 2 hours prior to surgery then every day) and intermittent pneumatic compression boots.  Plan: Maintain IVF and current regimen until nausea improves Encourage PO intake, ambulation Plan for possible discharge later today if nausea resolves, ambulating without difficulty, tolerating diet, pain controlled. Continue plan of care per Dr. Gerarda Fraction    LOS: 0 days    Regina Salinas 04/05/2018, 9:48 AM  Patient seen and examined with M.Cross. Agree with above. Will reassess in pm. If still has nausea will not plan dc.

## 2018-04-05 NOTE — Progress Notes (Signed)
Patient remains moderately nauseated.  Reports some improvement with zofran earlier this am but nausea returned.  Ate a few bites of a mashed potato for lunch but has decreased PO intake due to nausea.  States she ambulated in the hall with no dizziness.  States she currently does not feel like getting out of bed due to the nausea.  No emesis reported.  Plan to continue current regimen.  IVF to remain.  [place] for pain relief.  Antiemetics ordered PRN.  Plan for possible discharge in the am if symptoms improved.

## 2018-04-06 DIAGNOSIS — C530 Malignant neoplasm of endocervix: Secondary | ICD-10-CM | POA: Diagnosis not present

## 2018-04-06 MED ORDER — TRAMADOL HCL 50 MG PO TABS: 50.0000 mg | ORAL_TABLET | Freq: Four times a day (QID) | ORAL | 0 refills | Status: AC

## 2018-04-06 MED ORDER — IBUPROFEN 800 MG PO TABS: 800.0000 mg | ORAL_TABLET | Freq: Four times a day (QID) | ORAL | 0 refills | Status: DC

## 2018-04-06 MED ORDER — TRAMADOL HCL 50 MG PO TABS
50.0000 mg | ORAL_TABLET | Freq: Four times a day (QID) | ORAL | Status: DC
  Administered 2018-04-06: 50 mg via ORAL
  Filled 2018-04-06: qty 1

## 2018-04-06 NOTE — Discharge Summary (Signed)
Physician Discharge Summary  Patient ID: Regina Salinas MRN: [id number] DOB/AGE: 1966/07/15 52 y.o.  Admit date: 04/04/2018 Discharge date: 04/06/2018  Admission Diagnoses: Cervical cancer [facility])  Discharge Diagnoses:  Principal Problem:   Cervical cancer [facility])   Discharged Condition: good  Hospital Course:  1/ patient was admitted on 04/04/18 for robotic Rad Hyst 2/ surgery was uncomplicated  3/ on postoperative day 2 the patient was meeting discharge criteria: tolerating PO, ambulating, pain well controlled on oral medications, passing flatus. 4/ new medications on discharge include Ibuprofen and Tramadol (may change to oral Dilaudid pending tolerance check).   Consults: None  Treatments: surgery  Discharge Exam: Blood pressure 121/79, pulse (!) 56, temperature 98.2 F (36.8 C), temperature source Oral, resp. rate 16, height 5\' 4"  (1.626 m), weight 175 lb 0.7 oz (79.4 kg), SpO2 98 %. see progress note from today  Disposition: Discharge disposition: 01-Home or Self Care       Discharge Instructions    Call MD for:  persistant nausea and vomiting   Complete by:  As directed    Call MD for:  redness, tenderness, or signs of infection (pain, swelling, redness, odor or green/yellow discharge around incision site)   Complete by:  As directed    Call MD for:  temperature >100.4   Complete by:  As directed    Diet - low sodium heart healthy   Complete by:  As directed    Increase activity slowly   Complete by:  As directed      Allergies as of 04/06/2018      Reactions   Other Swelling, Other (See Comments)   Mild garlic      Medication List    TAKE these medications   cetirizine 10 MG tablet Commonly known as:  ZYRTEC Take 10 mg by mouth 2 (two) times daily.   ibuprofen 800 MG tablet Commonly known as:  ADVIL,MOTRIN Take 1 tablet (800 mg total) by mouth every 6 (six) hours.   ranitidine 150 MG tablet Commonly known as:  ZANTAC Take 150 mg by mouth 2 (two) times  daily.   traMADol 50 MG tablet Commonly known as:  ULTRAM Take 1 tablet (50 mg total) by mouth every 6 (six) hours for 7 days.   Vitamin D 2000 units Caps Take 2,000 Units by mouth daily.        Signed: Isabel Caprice 04/06/2018, 10:07 AM

## 2018-04-06 NOTE — Progress Notes (Addendum)
2 Days Post-Op Procedure(s) (LRB): XI ROBOTIC ASSISTED RADICAL HYSTERECTOMY AND SALPINGECTOMY (Bilateral) BILATERAL PELVIC LYMPHADENECTOMY (N/A)  Subjective: Patient reports nausea improved. No emesis, tolerating ~1/3 of diet.  Passing flatus. Worried no BM yet. Asking for Dilaudid po for home because the Oxy and Toradol ? gave her nausea.  Objective: Vital signs in last 24 hours: Temp:  [98.2 F (36.8 C)-98.6 F (37 C)] 98.2 F (36.8 C) (09/07 0603) Pulse Rate:  [52-57] 56 (09/07 0603) Resp:  [16-18] 16 (09/07 0603) BP: (108-121)/(57-79) 121/79 (09/07 0603) SpO2:  [93 %-98 %] 98 % (09/07 0603) Weight:  [175 lb 0.7 oz (79.4 kg)] 175 lb 0.7 oz (79.4 kg) (09/06 2300) Last BM Date: 04/03/18  Intake/Output from previous day: 09/06 0701 - 09/07 0700 In: 2230.8 [P.O.:940; I.V.:1290.8] Out: 3650 [Urine:3650]  Physical Examination: General: alert, cooperative and no distress Resp: clear to auscultation bilaterally Cardio: regular rate and rhythm GI: soft, NTND, Wounds CDI. NABS. No rebound, no guarding. Extremities: extremities normal, atraumatic, no cyanosis or edema and no edema, redness or tenderness in the calves or thighs  Foley to straight drain with clear, yellow urine  Labs:     N/A   Assessment: 52 y.o. s/p Procedure(s): XI ROBOTIC ASSISTED RADICAL HYSTERECTOMY AND SALPINGECTOMY BILATERAL PELVIC LYMPHADENECTOMY: stable Pain:  Pain is controlled with Tylenol and IV Dilaudid. Patient asking for oral Dilaudid. Will try Tramadol this am and if no nausea by lunch will dc home with that for prescription. If nausea with Tramadol then dc with low # oral Dilaudid.   Heme:Yesterday's check WNL for postop  CV: BP and HR stable.  Continue to monitor with vital signs.  GI:  Tolerating po, passing flatus. Worried no BM.  GU:  Foley in place with clear, yellow urine.  Adequate output reported.  FEN: No issues. Will dc IVF today.  Prophylaxis: pharmacologic prophylaxis (with  any of the following: enoxaparin (Lovenox) 40mg  SQ 2 hours prior to surgery then every day) and intermittent pneumatic compression boots.  Plan: Try oral Tramadol to see if tolerates Plan dc after lunch pending above. Discussed BM may take more time. She has stool softener at home she will use.    LOS: 1 day    Isabel Caprice 04/06/2018, 9:53 AM

## 2018-04-06 NOTE — Discharge Instructions (Signed)
04/06/2018  Activity: 1. Be up and out of the bed during the day.  Take a nap if needed.  You may walk up steps but be careful and use the hand rail.  Stair climbing will tire you more than you think, you may need to stop part way and rest.   2. No lifting or straining for 6 weeks.  3. No driving for 2 weeks.  Do Not drive if you are taking narcotic pain medicine.  4. Shower daily.  Use soap and water on your incision and pat dry; don't rub.   5. No sexual activity and nothing in the vagina for 8 weeks. (discuss with Dr. Denman George on followup)  Medications:  - Take ibuprofen and tylenol first line for pain control. Take these regularly (every 6 hours) to decrease the build up of pain.  - If necessary, for severe pain not relieved by ibuprofen, take percocet.  - While taking percocet you should take sennakot every night to reduce the likelihood of constipation. If this causes diarrhea, stop its use.  Diet: 1. Low sodium Heart Healthy Diet is recommended.  2. It is safe to use a laxative if you have difficulty moving your bowels.   Wound Care: 1. Keep clean and dry.  Shower daily.  Reasons to call the Doctor:   Fever - Oral temperature greater than 100.4 degrees Fahrenheit  Foul-smelling vaginal discharge  Difficulty urinating  Nausea and vomiting  Increased pain at the site of the incision that is unrelieved with pain medicine.  Difficulty breathing with or without chest pain  New calf pain especially if only on one side  Sudden, continuing increased vaginal bleeding with or without clots.   Follow-up: 1. See Everitt Amber in 2-3 weeks. 2. Followup in the office for foley removal as scheduled. If you do not have that appointment then call  Contacts: For questions or concerns you should contact:  Dr. Everitt Amber at [phone] After hours and on week-ends call [phone] and ask to speak to the physician on call for Gynecologic Oncology

## 2018-04-08 ENCOUNTER — Telehealth: Payer: Self-pay

## 2018-04-08 NOTE — Telephone Encounter (Signed)
Outgoing call to patient per [name] NP to see "how she doing post-op, how is nausea, is she better?" Pt reports her nausea is better and she denies need for any nausea medications.  Reports she is feeling better and takes her Tramadol and Ibuprofen as needed.  She is aware of upcoming f/u on Thurs at 1 pm for foley removal and appt on 9-27 with Dr Denman George. She has our contact info if needed and No other needs per pt at this time.

## 2018-04-09 ENCOUNTER — Other Ambulatory Visit (HOSPITAL_COMMUNITY): Payer: 59

## 2018-04-11 ENCOUNTER — Other Ambulatory Visit: Payer: Self-pay

## 2018-04-11 ENCOUNTER — Emergency Department (HOSPITAL_COMMUNITY)
Admission: EM | Admit: 2018-04-11 | Discharge: 2018-04-12 | Disposition: A | Discharge status: Home / Self Care | Payer: 59 | Source: Home / Self Care | Attending: Emergency Medicine | Admitting: Emergency Medicine

## 2018-04-11 ENCOUNTER — Encounter (HOSPITAL_COMMUNITY): Payer: Self-pay | Admitting: *Deleted

## 2018-04-11 ENCOUNTER — Inpatient Hospital Stay: Payer: 59

## 2018-04-11 ENCOUNTER — Other Ambulatory Visit: Payer: Self-pay | Admitting: *Deleted

## 2018-04-11 ENCOUNTER — Inpatient Hospital Stay: Payer: 59 | Attending: Gynecologic Oncology | Admitting: Gynecologic Oncology

## 2018-04-11 VITALS — BP 120/74 | HR 74 | Temp 98.5°F | Resp 20 | Ht 64.0 in | Wt 177.0 lb

## 2018-04-11 DIAGNOSIS — N3000 Acute cystitis without hematuria: Secondary | ICD-10-CM

## 2018-04-11 DIAGNOSIS — Z79899 Other long term (current) drug therapy: Secondary | ICD-10-CM

## 2018-04-11 DIAGNOSIS — Z978 Presence of other specified devices: Secondary | ICD-10-CM

## 2018-04-11 DIAGNOSIS — Z90722 Acquired absence of ovaries, bilateral: Secondary | ICD-10-CM | POA: Diagnosis not present

## 2018-04-11 DIAGNOSIS — D069 Carcinoma in situ of cervix, unspecified: Secondary | ICD-10-CM

## 2018-04-11 DIAGNOSIS — C530 Malignant neoplasm of endocervix: Secondary | ICD-10-CM | POA: Diagnosis present

## 2018-04-11 DIAGNOSIS — N390 Urinary tract infection, site not specified: Secondary | ICD-10-CM | POA: Insufficient documentation

## 2018-04-11 DIAGNOSIS — R339 Retention of urine, unspecified: Secondary | ICD-10-CM | POA: Diagnosis not present

## 2018-04-11 DIAGNOSIS — R8290 Unspecified abnormal findings in urine: Secondary | ICD-10-CM | POA: Diagnosis not present

## 2018-04-11 DIAGNOSIS — T83511A Infection and inflammatory reaction due to indwelling urethral catheter, initial encounter: Secondary | ICD-10-CM | POA: Insufficient documentation

## 2018-04-11 DIAGNOSIS — Z960 Presence of urogenital implants: Secondary | ICD-10-CM | POA: Diagnosis not present

## 2018-04-11 DIAGNOSIS — Z90710 Acquired absence of both cervix and uterus: Secondary | ICD-10-CM | POA: Insufficient documentation

## 2018-04-11 DIAGNOSIS — F17210 Nicotine dependence, cigarettes, uncomplicated: Secondary | ICD-10-CM

## 2018-04-11 LAB — CBC
HEMATOCRIT: 34 % — AB (ref 36.0–46.0)
HEMOGLOBIN: 11.2 g/dL — AB (ref 12.0–15.0)
MCH: 26.8 pg (ref 26.0–34.0)
MCHC: 32.9 g/dL (ref 30.0–36.0)
MCV: 81.3 fL (ref 78.0–100.0)
Platelets: 293 10*3/uL (ref 150–400)
RBC: 4.18 MIL/uL (ref 3.87–5.11)
RDW: 14.4 % (ref 11.5–15.5)
WBC: 14.7 10*3/uL — AB (ref 4.0–10.5)

## 2018-04-11 LAB — URINALYSIS, COMPLETE (UACMP) WITH MICROSCOPIC
Bilirubin Urine: NEGATIVE
Glucose, UA: NEGATIVE mg/dL
KETONES UR: NEGATIVE mg/dL
Nitrite: NEGATIVE
PH: 5 (ref 5.0–8.0)
PROTEIN: NEGATIVE mg/dL
Specific Gravity, Urine: 1.006 (ref 1.005–1.030)
WBC, UA: >50 WBC/hpf — ABNORMAL HIGH (ref 0–5)

## 2018-04-11 MED ORDER — MORPHINE SULFATE (PF) 4 MG/ML IV SOLN
4.0000 mg | Freq: Once | INTRAVENOUS | Status: AC
  Administered 2018-04-11: 4 mg via INTRAVENOUS
  Filled 2018-04-11: qty 1

## 2018-04-11 MED ORDER — LIDOCAINE HCL URETHRAL/MUCOSAL 2 % EX GEL
1.0000 "application " | Freq: Once | CUTANEOUS | Status: AC
  Administered 2018-04-11: 1 via URETHRAL
  Filled 2018-04-11: qty 5

## 2018-04-11 MED ORDER — ONDANSETRON HCL 4 MG/2ML IJ SOLN
4.0000 mg | Freq: Once | INTRAMUSCULAR | Status: AC
  Administered 2018-04-11: 4 mg via INTRAVENOUS
  Filled 2018-04-11: qty 2

## 2018-04-11 MED ORDER — SODIUM CHLORIDE 0.9 % IV SOLN
1.0000 g | Freq: Once | INTRAVENOUS | Status: AC
  Administered 2018-04-11: 1 g via INTRAVENOUS
  Filled 2018-04-11: qty 10

## 2018-04-11 MED ORDER — LIDOCAINE HCL URETHRAL/MUCOSAL 2 % EX GEL
1.0000 "application " | Freq: Once | CUTANEOUS | Status: AC
  Administered 2018-04-12: 1 via URETHRAL
  Filled 2018-04-11: qty 5

## 2018-04-11 NOTE — Patient Instructions (Addendum)
Plan to perform self cath every 3-4 hours or sooner if you feel full or uncomfortable to empty your bladder.  See instructions below. Call [phone] after hours for any issues and tell them to page the GYN ONC on call.  Our office will touch base with you tomorrow to see how you are doing.  You can increase the miralax to twice daily and continue taking the stool softeners daily as well.  If you develop loose stools, you can stop taking the miralax and stool softeners.  Plan to follow up with Dr. Denman George as scheduled or sooner if needed.  Please call for any questions or concerns.   Clean Intermittent Catheterization, Female Clean intermittent catheterization (CIC) is a procedure to remove urine from the bladder by placing a small, flexible tube (catheter) into the bladder though the urethra. The urethra is a tube in the body that carries urine from the bladder out of the body. CIC may be done when:  You cannot completely empty your bladder on your own. This may be due to a blockage in the bladder or urethra.  Your bladder leaks urine. This may happen when the muscles or nerves near the bladder are not working normally, so the bladder overflows.  Your health care provider will show you how to perform CIC and will help you to feel comfortable performing this procedure at home. Your health care provider will also help you to get the home care supplies that are needed for this procedure. What supplies will I need?  Germ-free (sterile), water-based lubricant.  A container for urine collection. You may also use the toilet to dispose of urine from the catheter.  A catheter. Your health care provider will determine the best size for you.  Sterile gloves.  Sterile gauze.  Medicated sterile swabs. How do I perform the procedure? Most people need CIC at least 4 times per dayto adequately empty the bladder. Your health care provider will tell you how often you should perform CIC. To perform CIC,  follow these steps: 1. Wash your hands with soap and water. If soap and water are not available, use hand sanitizer. 2. Prepare the supplies that you will use during the procedure. Open the catheter pack, the lubricant, and the pack of medicated sterile swabs. If you have been told to keep the procedure sterile, do not touch your supplies until you are wearing gloves. 3. Get in a comfortable position. It may be helpful to use a handheld mirror to look at the opening of your urethra. Possible positions include: ? Sitting on a toilet, a chair, or the edge of a bed. ? Standing next to a toilet with one foot on the toilet rim. ? Lying down with your head raised on pillows and your knees pointing to the ceiling. 4. If you are using a urine collection container, position it between your legs. 5. Urinate, if you are able. 6. Put on gloves. 7. Apply lubricant to about 2 inches (5 cm) of the tip of the catheter. 8. Set the catheter down on a clean, dry surface within reach. 9. Gently spread the folds of skin around your vagina (labia) with your non-dominant hand. For example, if you are right-handed, use your left hand to do this. With your other hand, clean the area around your urethra with medicated sterile swabs as told by your health care provider. 10. While keeping your labia spread apart, slowly insert the lubricated catheter 2-3 inches (5-8 cm) straight into your urethra until  urine flows freely. Allow urine to drain into the toilet or the urine collection container. 11. When urine starts to flow freely, insert the catheter 1 inch (3 cm) more. 12. When urine stops flowing, slowly remove the catheter. 13. Note the color, amount, and odor of the urine. 14. Clean your labia using soap and water. 15. Wash your hands with soap and water. 16. Follow package instructions about how to clean the catheter after each use.  What should I do at home? How Often Should I Perform CIC?  Do CIC to empty your  bladder every 4-6 hours or as often as told by your health care provider.  If you have symptoms of too much urine in your bladder (overdistension) and you are not able to urinate, perform CIC. Symptoms of overdistension may include: ? Restlessness. ? Sweating or chills. ? Headache. ? Flushedor pale skin. ? Cold limbs. ? Bloated lower abdomen. What Are Some Steps That I Can Take to Avoid Problems?  Drink enough fluid to keep your urine clear or pale yellow.  Avoid caffeine. Caffeine may make you urinate more frequently and more urgently.  Dispose of a multiple-use catheter when it becomes dry, brittle, or cloudy. This usually happens after you use the catheter for 1 week.  Take over-the-counter and prescription medicines only as told by your health care provider.  Keep all follow-up visits as told by your health care provider. This is important. Contact a health care provider if:  You have difficulty performing CIC.  You have urine leaking during CIC.  You have: ? Dark or cloudy urine. ? Blood in your urine or in your catheter. ? A change in the smell of your urine or discharge. ? A burning feeling while you urinate.  You feel nauseous or you vomit.  You have pain in your abdomen, your back, or your sides below your ribs.  You have swelling or redness around the opening of your urethra.  You develop a rash or sores on your skin. Get help right away if:  You have a fever.  You have symptoms that do not go away after 3 days.  You have symptoms that suddenly get worse.  You have severe pain.  The amount of urine that drains from your bladder decreases. This information is not intended to replace advice given to you by your health care provider. Make sure you discuss any questions you have with your health care provider. Document Released: 08/19/2010 Document Revised: 12/23/2015 Document Reviewed: 01/29/2015 Elsevier Interactive Patient Education  [employer].

## 2018-04-11 NOTE — ED Notes (Signed)
Postsurgical

## 2018-04-11 NOTE — ED Provider Notes (Signed)
[facility] DEPT Provider Note   CSN: [id number] Arrival date & time: 04/11/18  2125     History   Chief Complaint Chief Complaint  Patient presents with  . Fever  . Groin Pain    HPI Regina Salinas is a 52 y.o. female.  Patient presents to the emergency department with a chief complaint of urinary retention.  She states that she recently underwent radical hysterectomy 7 days ago.  She has a history of cervical cancer.  She is not on chemotherapy or immunosuppressed.  She reports that after having a Foley catheter removed today, she has been unable to urinate.  She reports fever at home to 101.  She is not on antibiotics.  She states that she has some left-sided flank pain.  She denies any other associated symptoms.  The history is provided by the patient. No language interpreter was used.    Past Medical History:  Diagnosis Date  . Cervical cancer ([place])   . Hemorrhoids   . PONV (postoperative nausea and vomiting)   . Postmenopausal   . Seasonal allergies   . Vitamin D deficiency     Patient Active Problem List   Diagnosis Date Noted  . Cervical cancer ([place]) 02/21/2018  . Adenocarcinoma ([place]) 02/18/2018  . Atypical squamous cell changes of undetermined significance (ASCUS) on cervical cytology with positive high risk human papilloma virus (HPV) 02/18/2018    Past Surgical History:  Procedure Laterality Date  . CERVICAL CONIZATION W/BX N/A 02/21/2018   Procedure: COLD KNIFE CONIZATION CERVIX;  Surgeon: Everitt Amber, MD;  Location: [facility];  Service: Gynecology;  Laterality: N/A;  . [place];  07-14-1999   dr Radene Knee [facility]   Bilateral Tubal Ligation w/ last one c/s  . DILATION AND CURETTAGE OF UTERUS N/A 02/21/2018   Procedure: DILATATION AND CURETTAGE OF THE UTERUS;  Surgeon: Everitt Amber, MD;  Location: [facility];  Service: Gynecology;  Laterality: N/A;  . ENDOMETRIAL ABLATION  2002  approx.    dr  Radene Knee  . HEMORRHOID SURGERY  02-20-2008    dr Brantley Stage  [facility]  . PELVIC LYMPH NODE DISSECTION N/A 04/04/2018   Procedure: BILATERAL PELVIC LYMPHADENECTOMY;  Surgeon: Everitt Amber, MD;  Location: [facility];  Service: Gynecology;  Laterality: N/A;  . ROBOTIC ASSISTED LAPAROSCOPIC HYSTERECTOMY AND SALPINGECTOMY Bilateral 04/04/2018   Procedure: XI ROBOTIC ASSISTED RADICAL HYSTERECTOMY AND SALPINGECTOMY;  Surgeon: Everitt Amber, MD;  Location: [facility];  Service: Gynecology;  Laterality: Bilateral;  . TUBAL LIGATION    . VAGINAL DELIVERY     1989     OB History    Gravida  3   Para  3   Term      Preterm      AB      Living        SAB      TAB      Ectopic      Multiple      Live Births               Home Medications    Prior to Admission medications   Medication Sig Start Date End Date Taking? Authorizing Provider  cetirizine (ZYRTEC) 10 MG tablet Take 10 mg by mouth 2 (two) times daily. 12/26/17  Yes [provider]  Cholecalciferol (VITAMIN D) 2000 units CAPS Take 2,000 Units by mouth daily.    Yes [provider]  ibuprofen (ADVIL,MOTRIN) 800 MG tablet Take 1  tablet (800 mg total) by mouth every 6 (six) hours. 04/06/18  Yes Precious Haws B, MD  ranitidine (ZANTAC) 150 MG tablet Take 150 mg by mouth 2 (two) times daily.  12/26/17  Yes [provider]  traMADol (ULTRAM) 50 MG tablet Take 1 tablet (50 mg total) by mouth every 6 (six) hours for 7 days. 04/06/18 04/13/18 Yes Isabel Caprice, MD    Family History Family History  Problem Relation Age of Onset  . Hypertension Mother   . Lupus Sister   . Cervical cancer Maternal Aunt   . Colon cancer Maternal Grandfather     Social History Social History   Tobacco Use  . Smoking status: Current Every Day Smoker    Packs/day: 0.00    Years: 25.00    Pack years: 0.00    Types: Cigarettes  . Smokeless tobacco: Never Used  . Tobacco comment: one cigarette every other day  Substance Use Topics  .  Alcohol use: Yes    Comment: Ocassional  . Drug use: Never     Allergies   Other   Review of Systems Review of Systems  All other systems reviewed and are negative.    Physical Exam Updated Vital Signs BP (!) 124/95 (BP Location: Left Arm)   Pulse 92   Temp 99.9 F (37.7 C) (Oral)   Resp 16   SpO2 98%   Physical Exam  Constitutional: She is oriented to person, place, and time. She appears well-developed and well-nourished.  HENT:  Head: Normocephalic and atraumatic.  Eyes: Pupils are equal, round, and reactive to light. Conjunctivae and EOM are normal.  Neck: Normal range of motion. Neck supple.  Cardiovascular: Normal rate and regular rhythm. Exam reveals no gallop and no friction rub.  No murmur heard. Pulmonary/Chest: Effort normal and breath sounds normal. No respiratory distress. She has no wheezes. She has no rales. She exhibits no tenderness.  Abdominal: Soft. Bowel sounds are normal. She exhibits no distension and no mass. There is no tenderness. There is no rebound and no guarding.  Musculoskeletal: Normal range of motion. She exhibits no edema or tenderness.  Neurological: She is alert and oriented to person, place, and time.  Skin: Skin is warm and dry.  Psychiatric: She has a normal mood and affect. Her behavior is normal. Judgment and thought content normal.  Nursing note and vitals reviewed.    ED Treatments / Results  Labs (all labs ordered are listed, but only abnormal results are displayed) Labs Reviewed  URINALYSIS, ROUTINE W REFLEX MICROSCOPIC - Abnormal; Notable for the following components:      Result Value   Specific Gravity, Urine 1.004 (*)    Hgb urine dipstick LARGE (*)    Nitrite POSITIVE (*)    Leukocytes, UA LARGE (*)    WBC, UA >50 (*)    Bacteria, UA RARE (*)    All other components within normal limits  BASIC METABOLIC PANEL - Abnormal; Notable for the following components:   Calcium 8.4 (*)    All other components within  normal limits  CBC - Abnormal; Notable for the following components:   WBC 14.7 (*)    Hemoglobin 11.2 (*)    HCT 34.0 (*)    All other components within normal limits  URINE CULTURE    EKG None  Radiology No results found.  Procedures Procedures (including critical care time)  Medications Ordered in ED Medications  sulfamethoxazole-trimethoprim (BACTRIM DS,SEPTRA DS) 800-160 MG per tablet 1 tablet (has  no administration in time range)  morphine 4 MG/ML injection 4 mg (4 mg Intravenous Given 04/11/18 2330)  ondansetron (ZOFRAN) injection 4 mg (4 mg Intravenous Given 04/11/18 2331)  cefTRIAXone (ROCEPHIN) 1 g in sodium chloride 0.9 % 100 mL IVPB (0 g Intravenous Stopped 04/12/18 0011)  lidocaine (XYLOCAINE) 2 % jelly 1 application (1 application Urethral Given 04/11/18 2330)  lidocaine (XYLOCAINE) 2 % jelly 1 application (1 application Urethral Given 04/12/18 0003)  lidocaine (XYLOCAINE) 2 % jelly 1 application (1 application Urethral Given 04/11/18 2321)     Initial Impression / Assessment and Plan / ED Course  I have reviewed the triage vital signs and the nursing notes.  Pertinent labs & imaging results that were available during my care of the patient were reviewed by me and considered in my medical decision making (see chart for details).     Patient is status post 7 days radical hysterectomy for cervical cancer.  She has had Foley catheter in place.  Catheter was removed earlier today.  Patient has been unable to urinate since the removal of the catheter.  She is retaining urine.  Will insert Foley catheter.  Patient feels significantly improved after Foley catheter placement.  She did have a very slight reaction to either the Rocephin or morphine.  She had a localized rash near the IV site.  These medications were stopped.  12:53 AM Patient reassessed.  She feels significantly improved.  I offered admission, but patient states that she would like to go home at this time.   I feel that this is reasonable plan.  Will give Bactrim for home.  Urine cultures pending.  Patient will call her doctor in the morning.  Strict return precautions given.  Final Clinical Impressions(s) / ED Diagnoses   Final diagnoses:  Acute cystitis without hematuria    ED Discharge Orders         Ordered    sulfamethoxazole-trimethoprim (BACTRIM DS,SEPTRA DS) 800-160 MG tablet  2 times daily     04/12/18 0050           Montine Circle, PA-C 04/12/18 0054    Molpus, Jenny Reichmann, MD 04/12/18 [phone]

## 2018-04-11 NOTE — ED Triage Notes (Signed)
Pt reports s/p radical hysterectomy on the 5th d/t cervical cancer.  She had her foley d/c'd today and had to have an I&O cath today as well.  She started to feel "bad" on the way home from her doctor's appt.  Fever of 100 at home.  She also endorses bila groin pain that radiates to her thighs.

## 2018-04-11 NOTE — ED Notes (Signed)
Attempt to catheterize patient-unable due to pain and swelling-will medicate as ordered before next attempt

## 2018-04-11 NOTE — Progress Notes (Signed)
Follow Up Note: Gyn-Onc  Regina Salinas 52 y.o. female  CC:  Chief Complaint  Patient presents with  . Cervical Cancer    Post-op follow up, voiding trial  . Foley catheter removal   Assessment/Plan: 52 year old female s/p robotic-assisted type III radical laparoscopic hysterectomy with bilateral salpingectomy and bilateral pelvic lymphadenectomy with Dr. Everitt Amber on 04/04/18 for invasive adenocarcinoma of the endocervix.  She is doing well post-operatively.  She is unable to urinate after foley removal at 1:06pm today.  She attempted to void until 3:45pm when she decided for intervention.  Discussed self catheterization.  Patient and husband would like to proceed with learning how to self cath instead of having an indwelling catheter replaced.  Patient and husband instructed on self catheterization and shown the procedure in detail.  I&O cath performed without difficulty with 500 cc of cloudy , tea colored urine.  Urine sent for culture and analysis.  Patient will be notified of results. She feels comfortable with the procedure and supplies given.  Will send script for catheters to her pharmacy.  After hour contact information discussed in case issues arise overnight or over the weekend.  Advised our office would touch base in the am.  She is advised to perform self catheterization every four hours or sooner if she felt symptoms of bladder fullness.  Patient and husband feel comfortable with the procedure at the end of the visit.  All questions answered.    Bowel regimen discussed including increasing miralax to twice daily along with continuing stool softeners.    She is advised to follow up with Dr. Denman George as scheduled or sooner if needed.  Reportable signs and symptoms reviewed.  HPI: Regina Salinas is a 52 year old female initially seen in consultation at the request of Dr. Radene Knee for adenocarcinoma of the endocervix and cervix. On January 23, 2018, she was seen by Dr. Radene Knee for her routine gynecologic  examination.  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  For evaluation for the pap findings, she underwent a colposcopic examination of the cervix on February 07, 2018.  She did have acetowhite changes with slight punctation located on the left side of the cervix near the opening.  This was biopsied along with endocervical curettings taken.  The pathology from this colposcopy with biopsies revealed in the cervical biopsy at 3:00 adenocarcinoma, and in the endocervical curettage adenocarcinoma.  The comment and pathology was that both of the specimens show fragments of adenocarcinoma and while some of the features suggest an endometrial origin, differential includes endocervical and endometrial origin.  There is minimal stroma present in the tumor fragments which limits evaluation of invasion.  She denied postmenopausal bleeding, postcoital bleeding, abnormal discharge, or pelvic pain.  Her gynecologic history includes an endometrial ablation in approximately 2012 and has been amenorrheic since that time.  There is also evidence of a benign endometrial biopsy in 2011.  She reports a personal history of an abnormal pap smear in 1990 that was treated with cryotherapy.  She had an additional abnormal Pap smear some years later however this was cleared with treatment for an infection.  She denies having abnormal Pap smears since that time.  There is evidence of a cytologically benign Pap smear in 2016.  HPV testing was not performed on that specimen.  The patient's family history is significant for a  maternal grandmother with a history of colon cancer, and a maternal aunt with a history of cervical cancer.  On 02/21/18 she underwent cold knife conization of the cervix. Final pathology confirmed: 1. Cervix, cone - ENDOCERVICAL ADENOCARCINOMA, MODERATELY DIFFERENTIATED. -  ECTOCERVICAL AND DEEP RESECTION MARGINS ARE POSITIVE, SEE COMMENT. - TUMOR SPANS AT LEAST 1.4 CM IN LENGTH AND 0.4 CM DEEP. 2. Endocervix, curettage, post cone - ENDOCERVICAL ADENOCARCINOMA. 3. Endometrium, curettage - ENDOCERVICAL ADENOCARCINOMA. Microscopic Comment 1. The sections are somewhat fragmented due to tumor involvement. The anterior sections display abundant tumor which spans at least 1.4 cm and involves the endocervical margin. The tumor invades at least 0.4 cm and involves the deep/radial margin. The ectocervical margin appears negative. Lymphovascular invasion is not seen.  On 03/11/18 she had a PET scan resulting: IMPRESSION: 1. Intense hypermetabolic activity at the uterine cervix consistent with cervical carcinoma. 2. No evidence of local nodal metastasis or distant metastatic Disease. 3. Relative low-density lesion in the liver cannot be fully characterize however does not have metabolic activity and therefore favored benign.  On 04/04/18, she underwent a robotic-assisted type III radical laparoscopic hysterectomy with bilateral salpingectomy and bilateral pelvic lymphadenectomy with Dr. Everitt Amber. Her post-operative course was positive for moderate POD1 nausea with decreased PO intake and mobility.  She was discharged on POD2.  Final pathology resulted: 1. Lymph node, biopsy, left external iliac - TWO OF TWO LYMPH NODES NEGATIVE FOR MALIGNANCY (0/2). 2. Lymph nodes, regional resection, right pelvic - NINE OF NINE LYMPH NODES NEGATIVE FOR CARCINOMA (0/9). 3. Lymph nodes, regional resection, left pelvic - FIVE OF FIVE LYMPH NODES NEGATIVE FOR CARCINOMA (0/5). 4. Uterus +/- tubes/ovaries, neoplastic, cervix, bilateral tubes - CERVIX: INVASIVE ADENOCARCINOMA, MODERATELY DIFFERENTIATED. TUMOR SPANS 2.5 CM WIDTH, 1.2 CM LENGTH AND 0.5 CM IN DEPTH. RESECTION MARGINS ARE NEGATIVE. SEE ONCOLOGY TABLE. - UTERUS: -ENDOMETRIUM: PROLIFERATIVE ENDOMETRIUM. NO HYPERPLASIA OR  MALIGNANCY. -MYOMETRIUM: UNREMARKABLE. NO MALIGNANCY. -SEROSA: UNREMARKABLE. NO MALIGNANCY. 5. Vaginal mucosa, new true anterior margin - BENIGN SQUAMOUS MUCOSA  Interval History: She presents today with her husband and family for foley removal.  She states she has been doing well at home.  Tolerating diet with no nausea or emesis.  Pain minimal and controlled with ibuprofen with occasional use of tramadol.  Ambulating without difficulty.  Foley draining adequate, yellow urine.  She has not had a BM since surgery.  She states she has taken Miralax this am.  Passing flatus. No vaginal spotting/bleeding reported. Final path discussed.  No concerns voiced.  Review of Systems: Constitutional: Feels well. No fever, chills, early satiety, change in appetite.  Cardiovascular: No chest pain, shortness of breath, or edema.  Pulmonary: No cough or wheeze.  Gastrointestinal: No nausea, vomiting, or diarrhea. Positive for constipation. Genitourinary: No frequency, urgency, or dysuria. No vaginal bleeding or discharge.  Musculoskeletal: No myalgia or joint pain. Neurologic: No weakness, numbness, or change in gait.  Psychology: No depression, anxiety, or insomnia.  Current Meds:  Outpatient Encounter Medications as of 04/11/2018  Medication Sig  . cetirizine (ZYRTEC) 10 MG tablet Take 10 mg by mouth 2 (two) times daily.  . Cholecalciferol (VITAMIN D) 2000 units CAPS Take 2,000 Units by mouth daily.   Marland Kitchen ibuprofen (ADVIL,MOTRIN) 800 MG tablet Take 1 tablet (800 mg total) by mouth every 6 (six) hours.  . ranitidine (ZANTAC) 150 MG tablet Take 150 mg by mouth 2 (two) times daily.   . traMADol (ULTRAM) 50 MG tablet Take 1 tablet (50 mg total) by mouth  every 6 (six) hours for 7 days.   No facility-administered encounter medications on file as of 04/11/2018.     Allergy:  Allergies  Allergen Reactions  . Other Swelling and Other (See Comments)    Mild garlic    Social Hx:   Social History    Socioeconomic History  . Marital status: Married    Spouse name: Not on file  . Number of children: Not on file  . Years of education: Not on file  . Highest education level: Not on file  Occupational History  . Not on file  Social Needs  . Financial resource strain: Not on file  . Food insecurity:    Worry: Not on file    Inability: Not on file  . Transportation needs:    Medical: Not on file    Non-medical: Not on file  Tobacco Use  . Smoking status: Current Every Day Smoker    Packs/day: 0.00    Years: 25.00    Pack years: 0.00    Types: Cigarettes  . Smokeless tobacco: Never Used  . Tobacco comment: one cigarette every other day  Substance and Sexual Activity  . Alcohol use: Yes    Comment: Ocassional  . Drug use: Never  . Sexual activity: Yes    Birth control/protection: Post-menopausal  Lifestyle  . Physical activity:    Days per week: Not on file    Minutes per session: Not on file  . Stress: Not on file  Relationships  . Social connections:    Talks on phone: Not on file    Gets together: Not on file    Attends religious service: Not on file    Active member of club or organization: Not on file    Attends meetings of clubs or organizations: Not on file    Relationship status: Not on file  . Intimate partner violence:    Fear of current or ex partner: Not on file    Emotionally abused: Not on file    Physically abused: Not on file    Forced sexual activity: Not on file  Other Topics Concern  . Not on file  Social History Narrative  . Not on file    Past Surgical Hx:  Past Surgical History:  Procedure Laterality Date  . CERVICAL CONIZATION W/BX N/A 02/21/2018   Procedure: COLD KNIFE CONIZATION CERVIX;  Surgeon: Everitt Amber, MD;  Location: [facility];  Service: Gynecology;  Laterality: N/A;  . [place];  07-14-1999   dr Radene Knee [facility]   Bilateral Tubal Ligation w/ last one c/s  . DILATION AND CURETTAGE OF UTERUS N/A  02/21/2018   Procedure: DILATATION AND CURETTAGE OF THE UTERUS;  Surgeon: Everitt Amber, MD;  Location: [facility];  Service: Gynecology;  Laterality: N/A;  . ENDOMETRIAL ABLATION  2002  approx.    dr Radene Knee  . HEMORRHOID SURGERY  02-20-2008    dr Brantley Stage  [facility]  . PELVIC LYMPH NODE DISSECTION N/A 04/04/2018   Procedure: BILATERAL PELVIC LYMPHADENECTOMY;  Surgeon: Everitt Amber, MD;  Location: [facility];  Service: Gynecology;  Laterality: N/A;  . ROBOTIC ASSISTED LAPAROSCOPIC HYSTERECTOMY AND SALPINGECTOMY Bilateral 04/04/2018   Procedure: XI ROBOTIC ASSISTED RADICAL HYSTERECTOMY AND SALPINGECTOMY;  Surgeon: Everitt Amber, MD;  Location: [facility];  Service: Gynecology;  Laterality: Bilateral;  . TUBAL LIGATION    . VAGINAL DELIVERY     1989    Past Medical Hx:  Past Medical History:  Diagnosis Date  .  Cervical cancer ([place])   . Hemorrhoids   . PONV (postoperative nausea and vomiting)   . Postmenopausal   . Seasonal allergies   . Vitamin D deficiency     Family Hx:  Family History  Problem Relation Age of Onset  . Hypertension Mother   . Lupus Sister   . Cervical cancer Maternal Aunt   . Colon cancer Maternal Grandfather     Vitals:  Blood pressure 120/74, pulse 74, temperature 98.5 F (36.9 C), temperature source Oral, resp. rate 20, height 5\' 4"  (1.626 m), weight 177 lb (80.3 kg), SpO2 100 %.  Physical Exam:  General: Well developed, well nourished female in no acute distress. Alert and oriented x 3.  Cardiovascular: Regular rate and rhythm. S1 and S2 normal.  Lungs: Clear to auscultation bilaterally. No wheezes/crackles/rhonchi noted.  Skin: No rashes or lesions present. Back: No CVA tenderness.  Abdomen: Abdomen soft, non-tender and non obese. Active bowel sounds in all quadrants. No evidence of a fluid wave or abdominal masses. Lap sites to the abdomen with dermabond without erythema or drainage.  Extremities: No bilateral cyanosis, edema, or clubbing.  210 cc of sterile  normal saline instilled in the bladder per Dr. Denman George.  Patient tolerated well.  Foley catheter then removed without difficulty at 1:06pm. I&O cath performed with patient and husband instructed on all steps at 3:45pm with 500 cc of urine out.  Patient tolerated procedure well.  She visualized the urethra with a mirror.  Dorothyann Gibbs, NP 04/11/2018, 1:52 PM

## 2018-04-11 NOTE — ED Notes (Signed)
Bed: WA01 Expected date:  Expected time:  Means of arrival:  Comments: Triage cancer patient

## 2018-04-12 ENCOUNTER — Telehealth: Payer: Self-pay | Admitting: Gynecologic Oncology

## 2018-04-12 LAB — URINALYSIS, ROUTINE W REFLEX MICROSCOPIC
Bilirubin Urine: NEGATIVE
Glucose, UA: NEGATIVE mg/dL
Ketones, ur: NEGATIVE mg/dL
Nitrite: POSITIVE — AB
PH: 7 (ref 5.0–8.0)
PROTEIN: NEGATIVE mg/dL
Specific Gravity, Urine: 1.004 — ABNORMAL LOW (ref 1.005–1.030)

## 2018-04-12 LAB — BASIC METABOLIC PANEL
Anion gap: 11 (ref 5–15)
BUN: 12 mg/dL (ref 6–20)
CALCIUM: 8.4 mg/dL — AB (ref 8.9–10.3)
CO2: 23 mmol/L (ref 22–32)
Chloride: 105 mmol/L (ref 98–111)
Creatinine, Ser: 0.84 mg/dL (ref 0.44–1.00)
Glucose, Bld: 96 mg/dL (ref 70–99)
POTASSIUM: 3.8 mmol/L (ref 3.5–5.1)
Sodium: 139 mmol/L (ref 135–145)

## 2018-04-12 MED ORDER — SULFAMETHOXAZOLE-TRIMETHOPRIM 800-160 MG PO TABS: 1.0000 | ORAL_TABLET | Freq: Two times a day (BID) | ORAL | 0 refills | Status: DC

## 2018-04-12 MED ORDER — SULFAMETHOXAZOLE-TRIMETHOPRIM 800-160 MG PO TABS
1.0000 | ORAL_TABLET | Freq: Once | ORAL | Status: AC
  Administered 2018-04-12: 1 via ORAL
  Filled 2018-04-12: qty 1

## 2018-04-12 NOTE — Telephone Encounter (Signed)
Called to touch base with patient.  She states when she got home yesterday after her appointment she developed a fever later in the evening as high as 101.  She states she felt weak and could not get warm.  After her appointment, she states she self cathed herself twice and feels she got a significant amount of urine out.  She called the after hours number and spoke with the triage RN.  She then went to the ED.  In the ED, she had the foley replaced and was sent home with Bactrim.  Another urine sample was obtained.    Patient stating she feels better today.  States she is no longer having pain like she did last night.  Advised that the situation was reviewed with Dr. Denman George who recommended leaving the foley in for another week then attempting a voiding trial again.  Patient advised that she would be contacted with the results of the urine culture and would be notified if she needed to be on a different antibiotic.  No concerns voiced at this time.  She is advised to call for any needs or concerns.  She is scheduled to follow up in the office on Wed, Sept 18 for foley removal or sooner if needed.

## 2018-04-12 NOTE — ED Notes (Signed)
Foley placed with assist Rob EDPA-lidocaine jelly for comfort-patient's labia and perineal area swollen. Urine yellow and cloudy with some initial sediment noted in urine

## 2018-04-13 LAB — URINE CULTURE: Culture: >=100000 — AB

## 2018-04-15 ENCOUNTER — Telehealth: Payer: Self-pay | Admitting: Gynecologic Oncology

## 2018-04-15 DIAGNOSIS — N390 Urinary tract infection, site not specified: Secondary | ICD-10-CM

## 2018-04-15 DIAGNOSIS — T83511A Infection and inflammatory reaction due to indwelling urethral catheter, initial encounter: Principal | ICD-10-CM

## 2018-04-15 MED ORDER — NITROFURANTOIN MONOHYD MACRO 100 MG PO CAPS: 100.0000 mg | ORAL_CAPSULE | Freq: Two times a day (BID) | ORAL | 0 refills | Status: DC

## 2018-04-15 NOTE — Telephone Encounter (Signed)
Called patient to inform her of urine culture results.  She states she is doing well.  She had a BM over the weekend.  Informed that her antibiotic would need to be changed to Macrobid from Bactrim due to the bactrim not being indicated for treatment of enterococcus.  Verbalizing understanding.  She is advised to stop taking the bactrim now.  No concerns voiced.  Advised to call for any needs.

## 2018-04-17 ENCOUNTER — Encounter: Payer: Self-pay | Admitting: Gynecologic Oncology

## 2018-04-17 ENCOUNTER — Inpatient Hospital Stay (HOSPITAL_BASED_OUTPATIENT_CLINIC_OR_DEPARTMENT_OTHER): Payer: 59 | Admitting: Gynecologic Oncology

## 2018-04-17 VITALS — BP 116/64 | HR 61 | Temp 98.3°F | Resp 20 | Ht 64.0 in | Wt 178.1 lb

## 2018-04-17 DIAGNOSIS — C539 Malignant neoplasm of cervix uteri, unspecified: Secondary | ICD-10-CM

## 2018-04-17 DIAGNOSIS — T83511A Infection and inflammatory reaction due to indwelling urethral catheter, initial encounter: Secondary | ICD-10-CM

## 2018-04-17 DIAGNOSIS — Z90722 Acquired absence of ovaries, bilateral: Secondary | ICD-10-CM

## 2018-04-17 DIAGNOSIS — Z90710 Acquired absence of both cervix and uterus: Secondary | ICD-10-CM

## 2018-04-17 DIAGNOSIS — N390 Urinary tract infection, site not specified: Secondary | ICD-10-CM

## 2018-04-17 DIAGNOSIS — R339 Retention of urine, unspecified: Secondary | ICD-10-CM

## 2018-04-17 MED ORDER — NITROFURANTOIN MONOHYD MACRO 100 MG PO CAPS: 100.0000 mg | ORAL_CAPSULE | Freq: Every day | ORAL | 2 refills | Status: DC

## 2018-04-17 MED ORDER — SELF-CATH STRAIGHT TIP MISC: 1.0000 [IU] | 5 refills | Status: DC | PRN

## 2018-04-17 NOTE — Progress Notes (Signed)
Follow Up Note: Gyn-Onc  Regina Salinas 52 y.o. female  CC:  Chief Complaint  Patient presents with  . Malignant neoplasm of cervix, unspecified site [facility])   Assessment/Plan: 52 year old female s/p robotic-assisted type III radical laparoscopic hysterectomy with bilateral salpingectomy and bilateral pelvic lymphadenectomy with Dr. Everitt Amber on 04/04/18 for invasive adenocarcinoma of the endocervix.  She is doing well post-operatively.  She was able to void and feels she has emptied her bladder s/p bladder trial.  Discussed self catheterization and advised to perform self catheterization every three-four hours if not emptying her bladder or sooner if she felt symptoms of bladder fullness.  Patient and husband feel comfortable with this plan.  Measuring device given along with recording sheet in case she has to intermittent self cath.  Given paper copy of script for straight caths if needed along with information on medical supply stores if needed for catheters.  Three in and out caths given to patient.  She is advised to complete the full course of antibiotics then to begin taking Macrobid 100 mg daily as prevention per Dr. Denman George if she is having to self cath at home. All questions answered.  Follow up appointment is on Sept 27,2019 with Dr. Denman George or sooner if needed.  She is advised to call for any needs.    HPI: Regina Salinas is a 52 year old female initially seen in consultation at the request of Dr. Radene Knee for adenocarcinoma of the endocervix and cervix. On January 23, 2018, she was seen by Dr. Radene Knee for her routine gynecologic examination.  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  For evaluation for the pap findings, she underwent a colposcopic examination of the cervix on February 07, 2018.  She did have acetowhite changes with slight punctation located on the left side of the  cervix near the opening.  This was biopsied along with endocervical curettings taken.  The pathology from this colposcopy with biopsies revealed in the cervical biopsy at 3:00 adenocarcinoma, and in the endocervical curettage adenocarcinoma.  The comment and pathology was that both of the specimens show fragments of adenocarcinoma and while some of the features suggest an endometrial origin, differential includes endocervical and endometrial origin.  There is minimal stroma present in the tumor fragments which limits evaluation of invasion.  She denied postmenopausal bleeding, postcoital bleeding, abnormal discharge, or pelvic pain.  Her gynecologic history includes an endometrial ablation in approximately 2012 and has been amenorrheic since that time.  There is also evidence of a benign endometrial biopsy in 2011.  She reports a personal history of an abnormal pap smear in 1990 that was treated with cryotherapy.  She had an additional abnormal Pap smear some years later however this was cleared with treatment for an infection.  She denies having abnormal Pap smears since that time.  There is evidence of a cytologically benign Pap smear in 2016.  HPV testing was not performed on that specimen.  The patient's family history is significant for a maternal grandmother with a history of colon cancer, and a maternal aunt with a history of cervical cancer.  On 02/21/18 she underwent cold knife conization of the cervix. Final pathology confirmed: 1. Cervix, cone - ENDOCERVICAL ADENOCARCINOMA, MODERATELY DIFFERENTIATED. - ECTOCERVICAL AND DEEP RESECTION MARGINS ARE POSITIVE, SEE COMMENT. - TUMOR SPANS AT LEAST 1.4 CM IN LENGTH AND 0.4 CM  DEEP. 2. Endocervix, curettage, post cone - ENDOCERVICAL ADENOCARCINOMA. 3. Endometrium, curettage - ENDOCERVICAL ADENOCARCINOMA. Microscopic Comment 1. The sections are somewhat fragmented due to tumor involvement. The anterior sections display abundant tumor which spans  at least 1.4 cm and involves the endocervical margin. The tumor invades at least 0.4 cm and involves the deep/radial margin. The ectocervical margin appears negative. Lymphovascular invasion is not seen.  On 03/11/18 she had a PET scan resulting: IMPRESSION: 1. Intense hypermetabolic activity at the uterine cervix consistent with cervical carcinoma. 2. No evidence of local nodal metastasis or distant metastatic Disease. 3. Relative low-density lesion in the liver cannot be fully characterize however does not have metabolic activity and therefore favored benign.  On 04/04/18, she underwent a robotic-assisted type III radical laparoscopic hysterectomy with bilateral salpingectomy and bilateral pelvic lymphadenectomy with Dr. Everitt Amber. Her post-operative course was positive for moderate POD1 nausea with decreased PO intake and mobility.  She was discharged on POD2.  Final pathology resulted: 1. Lymph node, biopsy, left external iliac - TWO OF TWO LYMPH NODES NEGATIVE FOR MALIGNANCY (0/2). 2. Lymph nodes, regional resection, right pelvic - NINE OF NINE LYMPH NODES NEGATIVE FOR CARCINOMA (0/9). 3. Lymph nodes, regional resection, left pelvic - FIVE OF FIVE LYMPH NODES NEGATIVE FOR CARCINOMA (0/5). 4. Uterus +/- tubes/ovaries, neoplastic, cervix, bilateral tubes - CERVIX: INVASIVE ADENOCARCINOMA, MODERATELY DIFFERENTIATED. TUMOR SPANS 2.5 CM WIDTH, 1.2 CM LENGTH AND 0.5 CM IN DEPTH. RESECTION MARGINS ARE NEGATIVE. SEE ONCOLOGY TABLE. - UTERUS: -ENDOMETRIUM: PROLIFERATIVE ENDOMETRIUM. NO HYPERPLASIA OR MALIGNANCY. -MYOMETRIUM: UNREMARKABLE. NO MALIGNANCY. -SEROSA: UNREMARKABLE. NO MALIGNANCY. 5. Vaginal mucosa, new true anterior margin - BENIGN SQUAMOUS MUCOSA  After last visit, she went to the ED that evening for fever and flank pain on 04/11/18.  She performed in and out cath twice at home after the appointment but states sought care at ED due to fever.  Foley was replaced in the ED. Urine culture  obtained in the office was positive for E Coli and enterococcus.  She was started on Bactrim but changed to Macrobid due to sensitivity results.   Interval History: She presents today with her husband for foley removal.  She states she has been doing well at home.  She started taking the Macrobid on Monday.  Foley has been draining clear, yellow urine at home.  No fever or chills.  Reporting upper back pain but states she feels it is related to having to sleep on her back. Tolerating diet with no nausea or emesis.  Pain minimal. Ambulating without difficulty.  Bowels are moving without difficulty.  Passing flatus. No vaginal spotting/bleeding reported.  No concerns voiced.  Review of Systems: Constitutional: Feels well. No fever, chills, early satiety, change in appetite.  Cardiovascular: No chest pain, shortness of breath, or edema.  Pulmonary: No cough or wheeze.  Gastrointestinal: No nausea, vomiting, or diarrhea. Positive for constipation. Genitourinary: No frequency, urgency, or dysuria. No vaginal bleeding or discharge.  Musculoskeletal: Positive for mild upper back pain. Neurologic: No weakness, numbness, or change in gait.  Psychology: No depression, anxiety, or insomnia.  Current Meds:  Outpatient Encounter Medications as of 04/17/2018  Medication Sig  . cetirizine (ZYRTEC) 10 MG tablet Take 10 mg by mouth 2 (two) times daily.  . Cholecalciferol (VITAMIN D) 2000 units CAPS Take 2,000 Units by mouth daily.   Marland Kitchen ibuprofen (ADVIL,MOTRIN) 800 MG tablet Take 1 tablet (800 mg total) by mouth every 6 (six) hours.  . nitrofurantoin, macrocrystal-monohydrate, (MACROBID) 100 MG capsule Take 1  capsule (100 mg total) by mouth 2 (two) times daily.  . ranitidine (ZANTAC) 150 MG tablet Take 150 mg by mouth 2 (two) times daily.   . traMADol (ULTRAM) 50 MG tablet Take by mouth as needed.  . Catheters (SELF-CATH STRAIGHT TIP) MISC 1 Units by Does not apply route as needed.  Derrill Memo ON 04/22/2018]  nitrofurantoin, macrocrystal-monohydrate, (MACROBID) 100 MG capsule Take 1 capsule (100 mg total) by mouth at bedtime. If performing self cath at home   No facility-administered encounter medications on file as of 04/17/2018.     Allergy:  Allergies  Allergen Reactions  . Other Swelling and Other (See Comments)    Mild garlic  . Rocephin [Ceftriaxone Sodium In Dextrose] Hives    Social Hx:   Social History   Socioeconomic History  . Marital status: Married    Spouse name: Not on file  . Number of children: Not on file  . Years of education: Not on file  . Highest education level: Not on file  Occupational History  . Not on file  Social Needs  . Financial resource strain: Not on file  . Food insecurity:    Worry: Not on file    Inability: Not on file  . Transportation needs:    Medical: Not on file    Non-medical: Not on file  Tobacco Use  . Smoking status: Current Every Day Smoker    Packs/day: 0.00    Years: 25.00    Pack years: 0.00    Types: Cigarettes  . Smokeless tobacco: Never Used  . Tobacco comment: one cigarette every other day  Substance and Sexual Activity  . Alcohol use: Yes    Comment: Ocassional  . Drug use: Never  . Sexual activity: Yes    Birth control/protection: Post-menopausal  Lifestyle  . Physical activity:    Days per week: Not on file    Minutes per session: Not on file  . Stress: Not on file  Relationships  . Social connections:    Talks on phone: Not on file    Gets together: Not on file    Attends religious service: Not on file    Active member of club or organization: Not on file    Attends meetings of clubs or organizations: Not on file    Relationship status: Not on file  . Intimate partner violence:    Fear of current or ex partner: Not on file    Emotionally abused: Not on file    Physically abused: Not on file    Forced sexual activity: Not on file  Other Topics Concern  . Not on file  Social History Narrative  . Not on  file    Past Surgical Hx:  Past Surgical History:  Procedure Laterality Date  . CERVICAL CONIZATION W/BX N/A 02/21/2018   Procedure: COLD KNIFE CONIZATION CERVIX;  Surgeon: Everitt Amber, MD;  Location: [facility];  Service: Gynecology;  Laterality: N/A;  . [place];  07-14-1999   dr Radene Knee [facility]   Bilateral Tubal Ligation w/ last one c/s  . DILATION AND CURETTAGE OF UTERUS N/A 02/21/2018   Procedure: DILATATION AND CURETTAGE OF THE UTERUS;  Surgeon: Everitt Amber, MD;  Location: [facility];  Service: Gynecology;  Laterality: N/A;  . ENDOMETRIAL ABLATION  2002  approx.    dr Radene Knee  . HEMORRHOID SURGERY  02-20-2008    dr Brantley Stage  [facility]  . PELVIC LYMPH NODE DISSECTION N/A 04/04/2018  Procedure: BILATERAL PELVIC LYMPHADENECTOMY;  Surgeon: Everitt Amber, MD;  Location: [facility];  Service: Gynecology;  Laterality: N/A;  . ROBOTIC ASSISTED LAPAROSCOPIC HYSTERECTOMY AND SALPINGECTOMY Bilateral 04/04/2018   Procedure: XI ROBOTIC ASSISTED RADICAL HYSTERECTOMY AND SALPINGECTOMY;  Surgeon: Everitt Amber, MD;  Location: [facility];  Service: Gynecology;  Laterality: Bilateral;  . TUBAL LIGATION    . VAGINAL DELIVERY     1989    Past Medical Hx:  Past Medical History:  Diagnosis Date  . Cervical cancer ([place])   . Hemorrhoids   . PONV (postoperative nausea and vomiting)   . Postmenopausal   . Seasonal allergies   . Vitamin D deficiency     Family Hx:  Family History  Problem Relation Age of Onset  . Hypertension Mother   . Lupus Sister   . Cervical cancer Maternal Aunt   . Colon cancer Maternal Grandfather     Vitals:  Blood pressure 116/64, pulse 61, temperature 98.3 F (36.8 C), temperature source Oral, resp. rate 20, height 5\' 4"  (1.626 m), weight 178 lb 1.6 oz (80.8 kg), SpO2 100 %.  Physical Exam:  General: Well developed, well nourished female in no acute distress. Alert and oriented x 3.  Cardiovascular: Regular rate and rhythm. S1 and S2 normal.   Lungs: Clear to auscultation bilaterally. No wheezes/crackles/rhonchi noted.  Skin: No rashes or lesions present. Back: Mild CVA tenderness.  Abdomen: Abdomen soft, non-tender and non obese. Active bowel sounds in all quadrants. No evidence of a fluid wave or abdominal masses. Lap sites to the abdomen healing well with dermabond without erythema or drainage.  Extremities: No bilateral cyanosis, edema, or clubbing.  260 cc of sterile normal saline instilled in the bladder per Dr. Denman George.  Patient tolerated well.  Foley catheter then removed without difficulty. Patient was able to void 75 cc immediately.  After ambulating, she had a BM and was unable to measure but felt that a moderate amount came out.  15 minutes later, she was able to urinate 100 cc of clear urine.  Feels that she completely emptied her bladder.  Feels confident with process for in and out cath if needed.  Dorothyann Gibbs, NP 04/17/2018, 12:02 PM

## 2018-04-17 NOTE — Patient Instructions (Signed)
Monitor your urine output.  After 3 hours if you have not urinate or only urinate a small amount such as less than 100 cc, please in and out cath yourself and measure the output. Please call for any needs.  We will also plan for you to begin taking Macrobid 100 mg once daily after you have completed the treatment course of 7 days to prevent another infection.  Please call for any needs or concerns.

## 2018-04-18 ENCOUNTER — Telehealth: Payer: Self-pay

## 2018-04-18 NOTE — Telephone Encounter (Addendum)
Outgoing call per [name] NP to check on patient to see how she is doing and how is voiding going?  Pt reports she is "peeing every 20-30 min" Reports it is clear and no pain or burning.  Reports it is sometimes 100 ml and sometimes a lighter amount, said she doesn't measure each time.  She also reports she was able to start peeing yesterday after she got home from dr appt, but was up every 30 min or so through the night. Per [name] NP and Dr Denman George- explained to patient that she still may be retaining some urine and that she can cath herself 3 times a day.    Per Elinor Parkinson NP and Dr Denman George - gave pt instructions that the next time she voids, she needs to measure what she voids, then self-caths and measure that- record amounts on paper.  She can cath 3 times a day, letting her know that at end of day, when she voids / caths self then hopefully she will be able to rest longer tonight since she cath'd before bed.  Explained to her per MD that this process can take a while to improve and reminded her she also has an upcoming f/u appt in our office on 9-27. If any concerns ie fever or pain - call our office before then. Pt voiced understanding.    I let her know to touch base with our office tomorrow with her recorded void amt and cath amt so we can see how she is doing - if we haven't heard from her then we will reach out to her tomorrow as well.  No other needs per pt and our office number was provided.

## 2018-04-19 ENCOUNTER — Telehealth: Payer: Self-pay

## 2018-04-19 DIAGNOSIS — R339 Retention of urine, unspecified: Secondary | ICD-10-CM

## 2018-04-19 DIAGNOSIS — C530 Malignant neoplasm of endocervix: Secondary | ICD-10-CM

## 2018-04-19 MED ORDER — IBUPROFEN 800 MG PO TABS: 800.0000 mg | ORAL_TABLET | Freq: Four times a day (QID) | ORAL | 0 refills | Status: DC | PRN

## 2018-04-19 MED ORDER — LIDOCAINE 5 % EX OINT: TOPICAL_OINTMENT | CUTANEOUS | 0 refills | Status: DC

## 2018-04-19 NOTE — Telephone Encounter (Signed)
Told Ms Zill that [name], NP said that she needs to void and then cath every 3 hours.  The Cath volumes given are too high.  This urine volume in the bladder can cause overstretching of the bladder and cause nerve damage. She is to call on Monday with the Urine and cath volumes.  Sent in a refill for Ibuprofen 800 mg tabs. Pt cc of soreness at the urethra from catheterization. Sent in a prescription for lidocaine 5% ointment to apply to urethra for  20 minutes prior to catheterization.  Wipe ointment off prior to catheter insertion. Pt verbalized understanding.

## 2018-04-19 NOTE — Telephone Encounter (Signed)
Urine Output         Cath Urine vol.

## 2018-04-19 NOTE — Telephone Encounter (Signed)
Regina Salinas gave the following urine out put and Catheter out put:                    Urine Output         Cath Urine vol.  04-18-18  2 pm  150 cc                        700 cc  7:30 pm           200 cc  8:30 pm                                               600 cc  11:30 pm          0                                 250 cc   04-19-18   5 am                   100 cc    7 am                    100 cc                      650 cc  10 am                  100 cc                        600 cc

## 2018-04-22 NOTE — Telephone Encounter (Signed)
Ms Scroggin called in Urine output and Cath out measurements.  Pt's values today increasing with urine output and less residual ~300. Flow sheet values scanned into epic. [name], NP said to continue to urinate and cath every 3 hours and log values and bring to appointment on Friday 12-24-17

## 2018-04-26 ENCOUNTER — Inpatient Hospital Stay (HOSPITAL_BASED_OUTPATIENT_CLINIC_OR_DEPARTMENT_OTHER): Payer: 59 | Admitting: Gynecologic Oncology

## 2018-04-26 ENCOUNTER — Encounter: Payer: Self-pay | Admitting: Gynecologic Oncology

## 2018-04-26 VITALS — BP 116/58 | HR 61 | Temp 98.5°F | Resp 20 | Ht 64.0 in | Wt 180.1 lb

## 2018-04-26 DIAGNOSIS — Z7189 Other specified counseling: Secondary | ICD-10-CM

## 2018-04-26 DIAGNOSIS — C539 Malignant neoplasm of cervix uteri, unspecified: Secondary | ICD-10-CM

## 2018-04-26 DIAGNOSIS — R339 Retention of urine, unspecified: Secondary | ICD-10-CM | POA: Insufficient documentation

## 2018-04-26 NOTE — Progress Notes (Signed)
See note

## 2018-04-26 NOTE — Patient Instructions (Signed)
Please return to see Dr Denman George in December as scheduled.  Call [phone] with questions.  Continue to catheterize after each 3 hour void until the volume that you void is consistently higher than the volume that you catheterize after the void (meaning your bladder is able to empty more than half of its volume itself).  At that point you can start spacing the voiding intervals to every 4 to 5 hours.   You can stop the post void catheterizations when you are consistently draining less than 100cc with each catheterization.

## 2018-04-26 NOTE — Progress Notes (Signed)
Follow Up Note: Gyn-Onc  Regina Salinas 51 y.o. female  CC:  Chief Complaint  Patient presents with  . Malignant neoplasm of cervix, unspecified site [facility])   Assessment/Plan: 52 year old female s/p robotic-assisted type III radical laparoscopic hysterectomy with bilateral salpingectomy and bilateral pelvic lymphadenectomy with Dr. 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  Postop urinary retention.  Apparent low risk disease on final pathology. Adjuvant therapy not indicated (following up with pathology regarding depth of cervical stromal invasion).   Urinary retention - continue self cath after voiding every 3 hours. Recommended that as the volume voided exceeds the volume cath'd, she can begin to space this to 4-5 hours. When post void residuals are consistently <100cc, she can abandon cath's.  Explained this process may take many months.   I will see her back in December for evaluation.    HPI: Regina Salinas is a 52 year old female initially seen in consultation at the request of Dr. Radene Knee for adenocarcinoma of the endocervix and cervix. On January 23, 2018, she was seen by Dr. Radene Knee for her routine gynecologic examination.  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  For evaluation for the pap findings, she underwent a colposcopic examination of the cervix on February 07, 2018.  She did have acetowhite changes with slight punctation located on the left side of the cervix near the opening.  This was biopsied along with endocervical curettings taken.  The pathology from this colposcopy with biopsies revealed in the cervical biopsy at 3:00 adenocarcinoma, and in the endocervical curettage adenocarcinoma.  The comment and pathology was that both of the specimens show fragments of adenocarcinoma and while some of the features suggest an  endometrial origin, differential includes endocervical and endometrial origin.  There is minimal stroma present in the tumor fragments which limits evaluation of invasion.  She denied postmenopausal bleeding, postcoital bleeding, abnormal discharge, or pelvic pain.  Her gynecologic history includes an endometrial ablation in approximately 2012 and has been amenorrheic since that time.  There is also evidence of a benign endometrial biopsy in 2011.  She reports a personal history of an abnormal pap smear in 1990 that was treated with cryotherapy.  She had an additional abnormal Pap smear some years later however this was cleared with treatment for an infection.  She denies having abnormal Pap smears since that time.  There is evidence of a cytologically benign Pap smear in 2016.  HPV testing was not performed on that specimen.  The patient's family history is significant for a maternal grandmother with a history of colon cancer, and a maternal aunt with a history of cervical cancer.  On 02/21/18 she underwent cold knife conization of the cervix. Final pathology confirmed: 1. Cervix, cone - ENDOCERVICAL ADENOCARCINOMA, MODERATELY DIFFERENTIATED. - ECTOCERVICAL AND DEEP RESECTION MARGINS ARE POSITIVE, SEE COMMENT. - TUMOR SPANS AT LEAST 1.4 CM IN LENGTH AND 0.4 CM DEEP. 2. Endocervix, curettage, post cone - ENDOCERVICAL ADENOCARCINOMA. 3. Endometrium, curettage - ENDOCERVICAL ADENOCARCINOMA. Microscopic Comment 1. The sections are somewhat fragmented due to tumor involvement. The anterior sections display abundant tumor which spans at least 1.4 cm and involves the endocervical margin. The tumor invades at least 0.4 cm and involves the deep/radial margin. The ectocervical margin appears negative. Lymphovascular invasion is not seen.  On 03/11/18 she had a PET scan resulting:  IMPRESSION: 1. Intense hypermetabolic activity at the uterine cervix consistent with cervical carcinoma. 2. No evidence of  local nodal metastasis or distant metastatic Disease. 3. Relative low-density lesion in the liver cannot be fully characterize however does not have metabolic activity and therefore favored benign.  On 04/04/18, she underwent a robotic-assisted type III radical laparoscopic hysterectomy with bilateral salpingectomy and bilateral pelvic lymphadenectomy with Dr. Everitt Amber. Her post-operative course was positive for moderate POD1 nausea with decreased PO intake and mobility.  She was discharged on POD2.  Final pathology resulted: 1. Lymph node, biopsy, left external iliac - TWO OF TWO LYMPH NODES NEGATIVE FOR MALIGNANCY (0/2). 2. Lymph nodes, regional resection, right pelvic - NINE OF NINE LYMPH NODES NEGATIVE FOR CARCINOMA (0/9). 3. Lymph nodes, regional resection, left pelvic - FIVE OF FIVE LYMPH NODES NEGATIVE FOR CARCINOMA (0/5). 4. Uterus +/- tubes/ovaries, neoplastic, cervix, bilateral tubes - CERVIX: INVASIVE ADENOCARCINOMA, MODERATELY DIFFERENTIATED. TUMOR SPANS 2.5 CM WIDTH, 1.2 CM LENGTH AND 0.5 CM IN DEPTH. RESECTION MARGINS ARE NEGATIVE. SEE ONCOLOGY TABLE. - UTERUS: -ENDOMETRIUM: PROLIFERATIVE ENDOMETRIUM. NO HYPERPLASIA OR MALIGNANCY. -MYOMETRIUM: UNREMARKABLE. NO MALIGNANCY. -SEROSA: UNREMARKABLE. NO MALIGNANCY. 5. Vaginal mucosa, new true anterior margin - BENIGN SQUAMOUS MUCOSA  After last visit, she went to the ED that evening for fever and flank pain on 04/11/18.  She performed in and out cath twice at home after the appointment but states sought care at ED due to fever.  Foley was replaced in the ED. Urine culture obtained in the office was positive for E Coli and enterococcus.  She was started on Bactrim but changed to Macrobid due to sensitivity results.   Interval History:  She had the foley removed. She again experienced urinary retention and was instructed to intermittent self cath every 3 hours after voiding. She has been logging voiding and post void residuals and  these have been consistently improving. In the past 24 hours, most post void residuals are 120-150% of her voided volumes, but still significant (most >300cc). She is taking daily 100mg  nitrofurantoin for prophylaxis.   Review of Systems: Constitutional: Feels well. No fever, chills, early satiety, change in appetite.  Cardiovascular: No chest pain, shortness of breath, or edema.  Pulmonary: No cough or wheeze.  Gastrointestinal: No nausea, vomiting, or diarrhea. Positive for constipation. Genitourinary: No frequency, urgency, or dysuria. No vaginal bleeding or discharge.  Musculoskeletal: No upper back pain. Neurologic: No weakness, numbness, or change in gait.  Psychology: No depression, anxiety, or insomnia.  Current Meds:  Outpatient Encounter Medications as of 04/26/2018  Medication Sig  . Catheters (SELF-CATH STRAIGHT TIP) MISC 1 Units by Does not apply route as needed.  . cetirizine (ZYRTEC) 10 MG tablet Take 10 mg by mouth 2 (two) times daily.  . Cholecalciferol (VITAMIN D) 2000 units CAPS Take 2,000 Units by mouth daily.   Marland Kitchen ibuprofen (ADVIL,MOTRIN) 800 MG tablet Take 1 tablet (800 mg total) by mouth every 6 (six) hours as needed.  . lidocaine (XYLOCAINE) 5 % ointment Apply to urethra 15 minutes prior to catheterization Wipe ointment away prior to catheterization.  . nitrofurantoin, macrocrystal-monohydrate, (MACROBID) 100 MG capsule Take 1 capsule (100 mg total) by mouth 2 (two) times daily.  . nitrofurantoin, macrocrystal-monohydrate, (MACROBID) 100 MG capsule Take 1 capsule (100 mg total) by mouth at bedtime. If performing self cath at home  . ranitidine (ZANTAC) 150 MG tablet Take 150 mg by mouth 2 (two) times daily.   . traMADol (ULTRAM) 50 MG tablet Take by mouth as  needed.   No facility-administered encounter medications on file as of 04/26/2018.     Allergy:  Allergies  Allergen Reactions  . Other Swelling and Other (See Comments)    Mild garlic  . Rocephin  [Ceftriaxone Sodium In Dextrose] Hives    Social Hx:   Social History   Socioeconomic History  . Marital status: Married    Spouse name: Not on file  . Number of children: Not on file  . Years of education: Not on file  . Highest education level: Not on file  Occupational History  . Not on file  Social Needs  . Financial resource strain: Not on file  . Food insecurity:    Worry: Not on file    Inability: Not on file  . Transportation needs:    Medical: Not on file    Non-medical: Not on file  Tobacco Use  . Smoking status: Current Every Day Smoker    Packs/day: 0.00    Years: 25.00    Pack years: 0.00    Types: Cigarettes  . Smokeless tobacco: Never Used  . Tobacco comment: one cigarette every other day  Substance and Sexual Activity  . Alcohol use: Yes    Comment: Ocassional  . Drug use: Never  . Sexual activity: Yes    Birth control/protection: Post-menopausal  Lifestyle  . Physical activity:    Days per week: Not on file    Minutes per session: Not on file  . Stress: Not on file  Relationships  . Social connections:    Talks on phone: Not on file    Gets together: Not on file    Attends religious service: Not on file    Active member of club or organization: Not on file    Attends meetings of clubs or organizations: Not on file    Relationship status: Not on file  . Intimate partner violence:    Fear of current or ex partner: Not on file    Emotionally abused: Not on file    Physically abused: Not on file    Forced sexual activity: Not on file  Other Topics Concern  . Not on file  Social History Narrative  . Not on file    Past Surgical Hx:  Past Surgical History:  Procedure Laterality Date  . CERVICAL CONIZATION W/BX N/A 02/21/2018   Procedure: COLD KNIFE CONIZATION CERVIX;  Surgeon: Everitt Amber, MD;  Location: [facility];  Service: Gynecology;  Laterality: N/A;  . [place];  07-14-1999   dr Radene Knee [facility]   Bilateral Tubal  Ligation w/ last one c/s  . DILATION AND CURETTAGE OF UTERUS N/A 02/21/2018   Procedure: DILATATION AND CURETTAGE OF THE UTERUS;  Surgeon: Everitt Amber, MD;  Location: [facility];  Service: Gynecology;  Laterality: N/A;  . ENDOMETRIAL ABLATION  2002  approx.    dr Radene Knee  . HEMORRHOID SURGERY  02-20-2008    dr Brantley Stage  [facility]  . PELVIC LYMPH NODE DISSECTION N/A 04/04/2018   Procedure: BILATERAL PELVIC LYMPHADENECTOMY;  Surgeon: Everitt Amber, MD;  Location: [facility];  Service: Gynecology;  Laterality: N/A;  . ROBOTIC ASSISTED LAPAROSCOPIC HYSTERECTOMY AND SALPINGECTOMY Bilateral 04/04/2018   Procedure: XI ROBOTIC ASSISTED RADICAL HYSTERECTOMY AND SALPINGECTOMY;  Surgeon: Everitt Amber, MD;  Location: [facility];  Service: Gynecology;  Laterality: Bilateral;  . TUBAL LIGATION    . VAGINAL DELIVERY     1989    Past Medical Hx:  Past Medical History:  Diagnosis Date  . Cervical cancer ([place])   . Hemorrhoids   . PONV (postoperative nausea and vomiting)   . Postmenopausal   . Seasonal allergies   . Vitamin D deficiency     Family Hx:  Family History  Problem Relation Age of Onset  . Hypertension Mother   . Lupus Sister   . Cervical cancer Maternal Aunt   . Colon cancer Maternal Grandfather     Vitals:  Blood pressure (!) 116/58, pulse 61, temperature 98.5 F (36.9 C), temperature source Oral, resp. rate 20, height 5\' 4"  (1.626 m), weight 180 lb 1.6 oz (81.7 kg), SpO2 99 %.  Physical Exam:  General: Well developed, well nourished female in no acute distress. Alert and oriented x 3.  Cardiovascular: Regular rate and rhythm. S1 and S2 normal.  Lungs: Clear to auscultation bilaterally. No wheezes/crackles/rhonchi noted.  Skin: No rashes or lesions present. Back: Mild CVA tenderness.  Abdomen: Abdomen soft, non-tender and non obese. Active bowel sounds in all quadrants. No evidence of a fluid wave or abdominal masses. Lap sites to the abdomen healing well with dermabond without erythema  or drainage.  Extremities: No bilateral cyanosis, edema, or clubbing.  Pelvic: vaginal cuff healing normally. No drainage, blood or lesions.   Patient voided 400cc, and post void cath (using sterile technique) was for 75cc.    30 minutes of direct face to face counseling time was spent with the patient. This included discussion about prognosis, therapy recommendations and postoperative side effects and are beyond the scope of routine postoperative care.  [name], MD 04/26/2018, 5:54 PM

## 2018-05-06 ENCOUNTER — Telehealth: Payer: Self-pay | Admitting: Gynecologic Oncology

## 2018-05-06 NOTE — Telephone Encounter (Signed)
Returned call to patient.  All questions answered.  Stating she is getting 100-150cc out when cathing. She has been taking Macrobid twice daily.  Advised that she was ordered to take at bedtime as prevention after her infection was treated.  Verbalizing understanding. Advised to call for any needs.

## 2018-05-13 ENCOUNTER — Ambulatory Visit: Payer: 59 | Admitting: Gynecologic Oncology

## 2018-05-16 ENCOUNTER — Telehealth: Payer: Self-pay

## 2018-05-16 NOTE — Telephone Encounter (Signed)
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 Reports had surgery 6 weeks ago and will f/u with [place] in Dec 2019.  Per [name] NP - immune system-wise, up to patient as she is weeks out from surgery and did not have chemo (if chooses to travel, wear mask and get up several times - walk around to prevent clot) but if she feels tired etc then may be should wait another time to travel.  Pt voiced understanding and said she has decided to put off travel plans this time. No other needs per pt at this time.

## 2018-05-22 ENCOUNTER — Telehealth: Payer: Self-pay | Admitting: *Deleted

## 2018-05-22 NOTE — Telephone Encounter (Signed)
Returned the patient's call, she needs to drop off some paperwork. Advised her to drop them off at either the front desk or volunteer desk

## 2018-05-23 ENCOUNTER — Telehealth: Payer: Self-pay | Admitting: *Deleted

## 2018-05-23 NOTE — Telephone Encounter (Signed)
Faxed work note to [employer] and left note at the front for the patient to pick up

## 2018-07-10 ENCOUNTER — Telehealth: Payer: Self-pay | Admitting: Gynecologic Oncology

## 2018-07-10 ENCOUNTER — Telehealth: Payer: Self-pay | Admitting: *Deleted

## 2018-07-10 NOTE — Telephone Encounter (Signed)
Returned call to patient.  She had called the office asking if it was ok for her to take cyclosporine for chronic hives and angioedema. Advised I would discuss with Dr. Denman George.  Advised her that I had a discussion with Dr. Denman George about the original medication her allergy doc was going to prescribe, xolair.  Advised that Dr. Denman George felt the decision needed to come from her allergy physician treating her and who had experience with the medications.  Advised that it is listed as increased risk of malignancy with both medications.  Advised her risk of recurrence with the cervical cancer is low but not zero and she had a PET scan prior to surgery which did not show any other concerning area.  That said, I did advise her that a new malignancy could occur. Advised we would contact her with any additional information from Dr. Denman George.

## 2018-07-10 NOTE — Telephone Encounter (Signed)
Patient called and left a message stating "My doctor wants me to start taking cyclosprine for my chronic hives. They wanted to check to make sure it was ok with Dr. Denman George."

## 2018-07-22 ENCOUNTER — Telehealth: Payer: Self-pay

## 2018-07-22 NOTE — Telephone Encounter (Signed)
Incoming call from pt regarding if Dr Denman George has had a chance to speak with Dr Marcelline Deist regarding if pt can try cyclosporine medication for chronic hives.  Per [name] NP - Dr Denman George reached out to Dr Marcelline Deist with no response back yet.  Per her, there is an increased chance of reoccurrence of cancer but she would prefer that be a decision/ discussion between pt and Dr Marcelline Deist.  Pt voiced understanding and verbalized she will talk with Dr Marcelline Deist further.  No other needs per pt at this time.

## 2018-07-26 ENCOUNTER — Encounter: Payer: Self-pay | Admitting: Gynecologic Oncology

## 2018-07-26 ENCOUNTER — Inpatient Hospital Stay: Payer: 59 | Attending: Gynecologic Oncology | Admitting: Gynecologic Oncology

## 2018-07-26 VITALS — BP 151/58 | HR 58 | Temp 97.9°F | Resp 16 | Ht 64.0 in | Wt 191.0 lb

## 2018-07-26 DIAGNOSIS — Z90710 Acquired absence of both cervix and uterus: Secondary | ICD-10-CM

## 2018-07-26 DIAGNOSIS — C530 Malignant neoplasm of endocervix: Secondary | ICD-10-CM | POA: Insufficient documentation

## 2018-07-26 DIAGNOSIS — C539 Malignant neoplasm of cervix uteri, unspecified: Secondary | ICD-10-CM

## 2018-07-26 DIAGNOSIS — Z90722 Acquired absence of ovaries, bilateral: Secondary | ICD-10-CM | POA: Diagnosis not present

## 2018-07-26 NOTE — Patient Instructions (Signed)
Please notify Dr Janeal Abadi at phone number [phone] if you notice vaginal bleeding, new pelvic or abdominal pains, bloating, feeling full easy, or a change in bladder or bowel function.   Please return to see Dr Antwan Bribiesca in 3 months. 

## 2018-07-26 NOTE — Progress Notes (Signed)
Follow Up Note: Gyn-Onc  Regina Salinas 52 y.o. female  CC:  Chief Complaint  Patient presents with  . Malignant neoplasm of cervix, unspecified site [facility])   Assessment/Plan: 52 year old female s/p robotic-assisted type III radical laparoscopic hysterectomy with bilateral salpingectomy and bilateral pelvic lymphadenectomy with Dr. 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   Apparent low risk disease on final pathology. Adjuvant therapy not indicated according to NCCN guidelines.   I will see her back in March, for evaluation.    HPI: Regina Salinas is a 52 year old female initially seen in consultation at the request of Dr. Radene Knee for adenocarcinoma of the endocervix and cervix. On January 23, 2018, she was seen by Dr. Radene Knee for her routine gynecologic examination.  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  For evaluation for the pap findings, she underwent a colposcopic examination of the cervix on February 07, 2018.  She did have acetowhite changes with slight punctation located on the left side of the cervix near the opening.  This was biopsied along with endocervical curettings taken.  The pathology from this colposcopy with biopsies revealed in the cervical biopsy at 3:00 adenocarcinoma, and in the endocervical curettage adenocarcinoma.  The comment and pathology was that both of the specimens show fragments of adenocarcinoma and while some of the features suggest an endometrial origin, differential includes endocervical and endometrial origin.  There is minimal stroma present in the tumor fragments which limits evaluation of invasion.  She denied postmenopausal bleeding, postcoital bleeding, abnormal discharge, or pelvic pain.  Her gynecologic history includes an endometrial ablation in approximately 2012 and has been amenorrheic  since that time.  There is also evidence of a benign endometrial biopsy in 2011.  She reports a personal history of an abnormal pap smear in 1990 that was treated with cryotherapy.  She had an additional abnormal Pap smear some years later however this was cleared with treatment for an infection.  She denies having abnormal Pap smears since that time.  There is evidence of a cytologically benign Pap smear in 2016.  HPV testing was not performed on that specimen.  The patient's family history is significant for a maternal grandmother with a history of colon cancer, and a maternal aunt with a history of cervical cancer.  On 02/21/18 she underwent cold knife conization of the cervix. Final pathology confirmed: 1. Cervix, cone - ENDOCERVICAL ADENOCARCINOMA, MODERATELY DIFFERENTIATED. - ECTOCERVICAL AND DEEP RESECTION MARGINS ARE POSITIVE, SEE COMMENT. - TUMOR SPANS AT LEAST 1.4 CM IN LENGTH AND 0.4 CM DEEP. 2. Endocervix, curettage, post cone - ENDOCERVICAL ADENOCARCINOMA. 3. Endometrium, curettage - ENDOCERVICAL ADENOCARCINOMA. Microscopic Comment 1. The sections are somewhat fragmented due to tumor involvement. The anterior sections display abundant tumor which spans at least 1.4 cm and involves the endocervical margin. The tumor invades at least 0.4 cm and involves the deep/radial margin. The ectocervical margin appears negative. Lymphovascular invasion is not seen.  On 03/11/18 she had a PET scan resulting: IMPRESSION: 1. Intense hypermetabolic activity at the uterine cervix consistent with cervical carcinoma. 2. No evidence of local nodal metastasis or distant metastatic Disease. 3. Relative low-density lesion in the liver cannot be fully characterize however does not have metabolic activity and therefore favored benign.  On 04/04/18, she underwent a robotic-assisted type III radical laparoscopic hysterectomy with bilateral salpingectomy  and bilateral pelvic lymphadenectomy with Dr. Everitt Amber.  Her post-operative course was positive for moderate POD1 nausea with decreased PO intake and mobility.  She was discharged on POD2.  Final pathology resulted: 1. Lymph node, biopsy, left external iliac - TWO OF TWO LYMPH NODES NEGATIVE FOR MALIGNANCY (0/2). 2. Lymph nodes, regional resection, right pelvic - NINE OF NINE LYMPH NODES NEGATIVE FOR CARCINOMA (0/9). 3. Lymph nodes, regional resection, left pelvic - FIVE OF FIVE LYMPH NODES NEGATIVE FOR CARCINOMA (0/5). 4. Uterus +/- tubes/ovaries, neoplastic, cervix, bilateral tubes - CERVIX: INVASIVE ADENOCARCINOMA, MODERATELY DIFFERENTIATED. TUMOR SPANS 2.5 CM WIDTH, 1.2 CM LENGTH AND 0.5 CM IN DEPTH. RESECTION MARGINS ARE NEGATIVE. SEE ONCOLOGY TABLE. - UTERUS: -ENDOMETRIUM: PROLIFERATIVE ENDOMETRIUM. NO HYPERPLASIA OR MALIGNANCY. -MYOMETRIUM: UNREMARKABLE. NO MALIGNANCY. -SEROSA: UNREMARKABLE. NO MALIGNANCY. 5. Vaginal mucosa, new true anterior margin - BENIGN SQUAMOUS MUCOSA  After last visit, she went to the ED that evening for fever and flank pain on 04/11/18.  She performed in and out cath twice at home after the appointment but states sought care at ED due to fever.  Foley was replaced in the ED. Urine culture obtained in the office was positive for E Coli and enterococcus.  She was started on Bactrim but changed to Macrobid due to sensitivity results.   Interval History:  She had some postop voiding issues which have subsequently resolved completely.  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 SHe will try cyclosporin for treatment.   Review of Systems: Constitutional: Feels well. No fever, chills, early satiety, change in appetite.  Cardiovascular: No chest pain, shortness of breath, or edema.  Pulmonary: No cough or wheeze.  Gastrointestinal: No nausea, vomiting, or diarrhea. Positive for constipation. Genitourinary: No frequency, urgency, or dysuria. No vaginal bleeding or discharge.   Musculoskeletal: No upper back pain. Neurologic: No weakness, numbness, or change in gait.  Psychology: No depression, anxiety, or insomnia.  Current Meds:  Outpatient Encounter Medications as of 07/26/2018  Medication Sig  . cetirizine (ZYRTEC) 10 MG tablet Take 10 mg by mouth 2 (two) times daily.  . Cholecalciferol (VITAMIN D) 2000 units CAPS Take 2,000 Units by mouth daily.   . famotidine (PEPCID) 40 MG tablet Take 40 mg by mouth 2 (two) times daily.  Marland Kitchen ibuprofen (ADVIL,MOTRIN) 800 MG tablet Take 1 tablet (800 mg total) by mouth every 6 (six) hours as needed.  . Levocetirizine Dihydrochloride (XYZAL PO) Take 5 mg by mouth daily.  Marland Kitchen lidocaine (XYLOCAINE) 5 % ointment Apply to urethra 15 minutes prior to catheterization Wipe ointment away prior to catheterization.  . nitrofurantoin, macrocrystal-monohydrate, (MACROBID) 100 MG capsule Take 1 capsule (100 mg total) by mouth 2 (two) times daily.  . nitrofurantoin, macrocrystal-monohydrate, (MACROBID) 100 MG capsule Take 1 capsule (100 mg total) by mouth at bedtime. If performing self cath at home  . predniSONE (DELTASONE) 10 MG tablet Take 10 mg by mouth 2 (two) times daily with a meal. 1-2 times daily as needed  . traMADol (ULTRAM) 50 MG tablet Take by mouth as needed.  . Catheters (SELF-CATH STRAIGHT TIP) MISC 1 Units by Does not apply route as needed.  . [DISCONTINUED] ranitidine (ZANTAC) 150 MG tablet Take 150 mg by mouth 2 (two) times daily.    No facility-administered encounter medications on file as of 07/26/2018.     Allergy:  Allergies  Allergen Reactions  . Other Swelling and Other (See Comments)    Mild garlic  . Rocephin [Ceftriaxone Sodium  In Dextrose] Hives    Social Hx:   Social History   Socioeconomic History  . Marital status: Married    Spouse name: Not on file  . Number of children: Not on file  . Years of education: Not on file  . Highest education level: Not on file  Occupational History  . Not on file   Social Needs  . Financial resource strain: Not on file  . Food insecurity:    Worry: Not on file    Inability: Not on file  . Transportation needs:    Medical: Not on file    Non-medical: Not on file  Tobacco Use  . Smoking status: Former Smoker    Packs/day: 0.00    Years: 25.00    Pack years: 0.00    Types: Cigarettes  . Smokeless tobacco: Never Used  . Tobacco comment: quit in June 2019  Substance and Sexual Activity  . Alcohol use: Yes    Comment: Ocassional  . Drug use: Never  . Sexual activity: Yes    Birth control/protection: Post-menopausal  Lifestyle  . Physical activity:    Days per week: Not on file    Minutes per session: Not on file  . Stress: Not on file  Relationships  . Social connections:    Talks on phone: Not on file    Gets together: Not on file    Attends religious service: Not on file    Active member of club or organization: Not on file    Attends meetings of clubs or organizations: Not on file    Relationship status: Not on file  . Intimate partner violence:    Fear of current or ex partner: Not on file    Emotionally abused: Not on file    Physically abused: Not on file    Forced sexual activity: Not on file  Other Topics Concern  . Not on file  Social History Narrative  . Not on file    Past Surgical Hx:  Past Surgical History:  Procedure Laterality Date  . CERVICAL CONIZATION W/BX N/A 02/21/2018   Procedure: COLD KNIFE CONIZATION CERVIX;  Surgeon: Everitt Amber, MD;  Location: [facility];  Service: Gynecology;  Laterality: N/A;  . [place];  07-14-1999   dr Radene Knee [place]   Bilateral Tubal Ligation w/ last one c/s  . DILATION AND CURETTAGE OF UTERUS N/A 02/21/2018   Procedure: DILATATION AND CURETTAGE OF THE UTERUS;  Surgeon: Everitt Amber, MD;  Location: [facility];  Service: Gynecology;  Laterality: N/A;  . ENDOMETRIAL ABLATION  2002  approx.    dr Radene Knee  . HEMORRHOID SURGERY  02-20-2008    dr  Brantley Stage  [facility]  . PELVIC LYMPH NODE DISSECTION N/A 04/04/2018   Procedure: BILATERAL PELVIC LYMPHADENECTOMY;  Surgeon: Everitt Amber, MD;  Location: [facility];  Service: Gynecology;  Laterality: N/A;  . ROBOTIC ASSISTED LAPAROSCOPIC HYSTERECTOMY AND SALPINGECTOMY Bilateral 04/04/2018   Procedure: XI ROBOTIC ASSISTED RADICAL HYSTERECTOMY AND SALPINGECTOMY;  Surgeon: Everitt Amber, MD;  Location: [facility];  Service: Gynecology;  Laterality: Bilateral;  . TUBAL LIGATION    . VAGINAL DELIVERY     1989    Past Medical Hx:  Past Medical History:  Diagnosis Date  . Cervical cancer ([place])   . Hemorrhoids   . Hives   . PONV (postoperative nausea and vomiting)   . Postmenopausal   . Seasonal allergies   . Vitamin D deficiency     Family  Hx:  Family History  Problem Relation Age of Onset  . Hypertension Mother   . Lupus Sister   . Cervical cancer Maternal Aunt   . Colon cancer Maternal Grandfather     Vitals:  Blood pressure (!) 151/58, pulse (!) 58, temperature 97.9 F (36.6 C), temperature source Oral, resp. rate 16, height 5\' 4"  (1.626 m), weight 191 lb (86.6 kg), SpO2 100 %.  Physical Exam:  General: Well developed, well nourished female in no acute distress. Alert and oriented x 3.  Cardiovascular: Regular rate and rhythm. S1 and S2 normal.  Lungs: Clear to auscultation bilaterally. No wheezes/crackles/rhonchi noted.  Skin: No rashes or lesions present. Back: Mild CVA tenderness.  Abdomen: Abdomen soft, non-tender and non obese. Active bowel sounds in all quadrants. No evidence of a fluid wave or abdominal masses.  Soft abdomen and incisions.  Extremities: No bilateral cyanosis, edema, or clubbing.  Pelvic: vaginal cuff smooth and regular, 7mm tongue of granulation tissue from left fornix. No masses or lesions.  [name], MD 07/26/2018, 11:07 AM

## 2018-09-24 ENCOUNTER — Telehealth: Payer: Self-pay

## 2018-09-24 NOTE — Telephone Encounter (Signed)
Regina Salinas stated that she noticed some light pink blood on the toilet tissue last night. None today. She has had some intermittent abdominal pain near the left pelvic bone. Onset a few weeks No urinary burning or frequency noted. Next f/u is 10-30-18 with Dr. Denman George.  Reviewed above with [name], NP. Pt to come in tomorrow 09-25-18 at 2:30 to be evaluated by Dr. Denman George.

## 2018-09-25 ENCOUNTER — Encounter: Payer: Self-pay | Admitting: Gynecologic Oncology

## 2018-09-25 ENCOUNTER — Inpatient Hospital Stay: Payer: 59 | Attending: Gynecologic Oncology | Admitting: Gynecologic Oncology

## 2018-09-25 VITALS — BP 123/51 | HR 61 | Temp 97.9°F | Resp 20 | Ht 64.0 in | Wt 194.5 lb

## 2018-09-25 DIAGNOSIS — C530 Malignant neoplasm of endocervix: Secondary | ICD-10-CM | POA: Insufficient documentation

## 2018-09-25 DIAGNOSIS — N939 Abnormal uterine and vaginal bleeding, unspecified: Secondary | ICD-10-CM | POA: Insufficient documentation

## 2018-09-25 DIAGNOSIS — Z9079 Acquired absence of other genital organ(s): Secondary | ICD-10-CM

## 2018-09-25 DIAGNOSIS — N898 Other specified noninflammatory disorders of vagina: Secondary | ICD-10-CM

## 2018-09-25 DIAGNOSIS — Z90710 Acquired absence of both cervix and uterus: Secondary | ICD-10-CM

## 2018-09-25 DIAGNOSIS — M25552 Pain in left hip: Secondary | ICD-10-CM

## 2018-09-25 DIAGNOSIS — R102 Pelvic and perineal pain: Secondary | ICD-10-CM

## 2018-09-25 DIAGNOSIS — C539 Malignant neoplasm of cervix uteri, unspecified: Secondary | ICD-10-CM | POA: Diagnosis not present

## 2018-09-25 NOTE — Progress Notes (Signed)
Follow Up Note: Gyn-Onc  Regina Salinas 53 y.o. female  CC:  Chief Complaint  Patient presents with  . Malignant neoplasm of cervix, unspecified site ([place])  . Vaginal Bleeding  . left pelvic pain   Assessment/Plan: 53 year old female s/p robotic-assisted type III radical laparoscopic hysterectomy with bilateral salpingectomy and bilateral pelvic lymphadenectomy with Dr. 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   Apparent low risk disease on final pathology. Adjuvant therapy not indicated according to NCCN guidelines.    New vaginal bleeding and new left sided pelvic pain.  - follow-up biopsies of cuff granulation tissue. Ensure no recurrence.  - CT abd/pelvis to rule out deep pelvic recurrence.   HPI: Regina Salinas is a 53 year old female initially seen in consultation at the request of Dr. Radene Knee for adenocarcinoma of the endocervix and cervix. On January 23, 2018, she was seen by Dr. Radene Knee for her routine gynecologic examination.  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  For evaluation for the pap findings, she underwent a colposcopic examination of the cervix on February 07, 2018.  She did have acetowhite changes with slight punctation located on the left side of the cervix near the opening.  This was biopsied along with endocervical curettings taken.  The pathology from this colposcopy with biopsies revealed in the cervical biopsy at 3:00 adenocarcinoma, and in the endocervical curettage adenocarcinoma.  The comment and pathology was that both of the specimens show fragments of adenocarcinoma and while some of the features suggest an endometrial origin, differential includes endocervical and endometrial origin.  There is minimal stroma present in the tumor fragments which limits evaluation of invasion.  She denied postmenopausal  bleeding, postcoital bleeding, abnormal discharge, or pelvic pain.  Her gynecologic history includes an endometrial ablation in approximately 2012 and has been amenorrheic since that time.  There is also evidence of a benign endometrial biopsy in 2011.  She reports a personal history of an abnormal pap smear in 1990 that was treated with cryotherapy.  She had an additional abnormal Pap smear some years later however this was cleared with treatment for an infection.  She denies having abnormal Pap smears since that time.  There is evidence of a cytologically benign Pap smear in 2016.  HPV testing was not performed on that specimen.  The patient's family history is significant for a maternal grandmother with a history of colon cancer, and a maternal aunt with a history of cervical cancer.  On 02/21/18 she underwent cold knife conization of the cervix. Final pathology confirmed: 1. Cervix, cone - ENDOCERVICAL ADENOCARCINOMA, MODERATELY DIFFERENTIATED. - ECTOCERVICAL AND DEEP RESECTION MARGINS ARE POSITIVE, SEE COMMENT. - TUMOR SPANS AT LEAST 1.4 CM IN LENGTH AND 0.4 CM DEEP. 2. Endocervix, curettage, post cone - ENDOCERVICAL ADENOCARCINOMA. 3. Endometrium, curettage - ENDOCERVICAL ADENOCARCINOMA. Microscopic Comment 1. The sections are somewhat fragmented due to tumor involvement. The anterior sections display abundant tumor which spans at least 1.4 cm and involves the endocervical margin. The tumor invades at least 0.4 cm and involves the deep/radial margin. The ectocervical margin appears negative. Lymphovascular invasion is not seen.  On 03/11/18 she had a PET scan resulting: IMPRESSION: 1. Intense hypermetabolic activity at the uterine cervix consistent with cervical carcinoma. 2. No evidence of local nodal metastasis or distant metastatic Disease. 3. Relative low-density lesion in the  liver cannot be fully characterize however does not have metabolic activity and therefore favored benign.  On  04/04/18, she underwent a robotic-assisted type III radical laparoscopic hysterectomy with bilateral salpingectomy and bilateral pelvic lymphadenectomy with Dr. Everitt Amber. Her post-operative course was positive for moderate POD1 nausea with decreased PO intake and mobility.  She was discharged on POD2.  Final pathology resulted: 1. Lymph node, biopsy, left external iliac - TWO OF TWO LYMPH NODES NEGATIVE FOR MALIGNANCY (0/2). 2. Lymph nodes, regional resection, right pelvic - NINE OF NINE LYMPH NODES NEGATIVE FOR CARCINOMA (0/9). 3. Lymph nodes, regional resection, left pelvic - FIVE OF FIVE LYMPH NODES NEGATIVE FOR CARCINOMA (0/5). 4. Uterus +/- tubes/ovaries, neoplastic, cervix, bilateral tubes - CERVIX: INVASIVE ADENOCARCINOMA, MODERATELY DIFFERENTIATED. TUMOR SPANS 2.5 CM WIDTH, 1.2 CM LENGTH AND 0.5 CM IN DEPTH. RESECTION MARGINS ARE NEGATIVE. SEE ONCOLOGY TABLE. - UTERUS: -ENDOMETRIUM: PROLIFERATIVE ENDOMETRIUM. NO HYPERPLASIA OR MALIGNANCY. -MYOMETRIUM: UNREMARKABLE. NO MALIGNANCY. -SEROSA: UNREMARKABLE. NO MALIGNANCY. 5. Vaginal mucosa, new true anterior margin - BENIGN SQUAMOUS MUCOSA  She had some postop voiding issues which have subsequently resolved completely.  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 She was prescribed cyclosporin for treatment.   Interval History:  Since February, 2020 she has been experiencing light pink vaginal spotting intermittently.  She has developed pain in the left lower quadrant - worse with pressure applied to abdomen. No relationship to eating/voiding/BM's.   Review of Systems: Constitutional: Feels well. No fever, chills, early satiety, change in appetite.  Cardiovascular: No chest pain, shortness of breath, or edema.  Pulmonary: No cough or wheeze.  Gastrointestinal: No nausea, vomiting, or diarrhea. Positive for constipation. + abdominal pain Genitourinary: No frequency, urgency, or dysuria. +  vaginal bleeding Musculoskeletal: No upper back pain. Neurologic: No weakness, numbness, or change in gait.  Psychology: No depression, anxiety, or insomnia.  Current Meds:  Outpatient Encounter Medications as of 09/25/2018  Medication Sig  . cetirizine (ZYRTEC) 10 MG tablet Take 10 mg by mouth 2 (two) times daily.  . Cholecalciferol (VITAMIN D) 2000 units CAPS Take 2,000 Units by mouth daily.   . cycloSPORINE modified (NEORAL) 50 MG capsule Take 50 mg by mouth 2 (two) times daily.   . famotidine (PEPCID) 40 MG tablet Take 40 mg by mouth 2 (two) times daily.  Marland Kitchen ibuprofen (ADVIL,MOTRIN) 800 MG tablet Take 1 tablet (800 mg total) by mouth every 6 (six) hours as needed.  . Levocetirizine Dihydrochloride (XYZAL PO) Take 5 mg by mouth daily.  . predniSONE (DELTASONE) 10 MG tablet Take 10 mg by mouth as needed. 1-2 times daily as needed   . [DISCONTINUED] Catheters (SELF-CATH STRAIGHT TIP) MISC 1 Units by Does not apply route as needed. (Patient not taking: Reported on 09/25/2018)  . [DISCONTINUED] lidocaine (XYLOCAINE) 5 % ointment Apply to urethra 15 minutes prior to catheterization Wipe ointment away prior to catheterization. (Patient not taking: Reported on 09/25/2018)  . [DISCONTINUED] nitrofurantoin, macrocrystal-monohydrate, (MACROBID) 100 MG capsule Take 1 capsule (100 mg total) by mouth 2 (two) times daily. (Patient not taking: Reported on 09/25/2018)  . [DISCONTINUED] nitrofurantoin, macrocrystal-monohydrate, (MACROBID) 100 MG capsule Take 1 capsule (100 mg total) by mouth at bedtime. If performing self cath at home (Patient not taking: Reported on 09/25/2018)  . [DISCONTINUED] traMADol (ULTRAM) 50 MG tablet Take by mouth as needed.   No facility-administered encounter medications on file as of 09/25/2018.     Allergy:  Allergies  Allergen Reactions  . Other  Swelling and Other (See Comments)    Mild garlic  . Rocephin [Ceftriaxone Sodium In Dextrose] Hives    Social Hx:   Social  History   Socioeconomic History  . Marital status: Married    Spouse name: Not on file  . Number of children: Not on file  . Years of education: Not on file  . Highest education level: Not on file  Occupational History  . Not on file  Social Needs  . Financial resource strain: Not on file  . Food insecurity:    Worry: Not on file    Inability: Not on file  . Transportation needs:    Medical: Not on file    Non-medical: Not on file  Tobacco Use  . Smoking status: Former Smoker    Packs/day: 0.00    Years: 25.00    Pack years: 0.00    Types: Cigarettes  . Smokeless tobacco: Never Used  . Tobacco comment: quit in June 2019  Substance and Sexual Activity  . Alcohol use: Yes    Comment: Ocassional  . Drug use: Never  . Sexual activity: Yes    Birth control/protection: Post-menopausal  Lifestyle  . Physical activity:    Days per week: Not on file    Minutes per session: Not on file  . Stress: Not on file  Relationships  . Social connections:    Talks on phone: Not on file    Gets together: Not on file    Attends religious service: Not on file    Active member of club or organization: Not on file    Attends meetings of clubs or organizations: Not on file    Relationship status: Not on file  . Intimate partner violence:    Fear of current or ex partner: Not on file    Emotionally abused: Not on file    Physically abused: Not on file    Forced sexual activity: Not on file  Other Topics Concern  . Not on file  Social History Narrative  . Not on file    Past Surgical Hx:  Past Surgical History:  Procedure Laterality Date  . CERVICAL CONIZATION W/BX N/A 02/21/2018   Procedure: COLD KNIFE CONIZATION CERVIX;  Surgeon: Everitt Amber, MD;  Location: [facility];  Service: Gynecology;  Laterality: N/A;  . [place];  07-14-1999   dr Radene Knee [facility]   Bilateral Tubal Ligation w/ last one c/s  . DILATION AND CURETTAGE OF UTERUS N/A 02/21/2018    Procedure: DILATATION AND CURETTAGE OF THE UTERUS;  Surgeon: Everitt Amber, MD;  Location: [facility];  Service: Gynecology;  Laterality: N/A;  . ENDOMETRIAL ABLATION  2002  approx.    dr Radene Knee  . HEMORRHOID SURGERY  02-20-2008    dr Brantley Stage  [facility]  . PELVIC LYMPH NODE DISSECTION N/A 04/04/2018   Procedure: BILATERAL PELVIC LYMPHADENECTOMY;  Surgeon: Everitt Amber, MD;  Location: [facility];  Service: Gynecology;  Laterality: N/A;  . ROBOTIC ASSISTED LAPAROSCOPIC HYSTERECTOMY AND SALPINGECTOMY Bilateral 04/04/2018   Procedure: XI ROBOTIC ASSISTED RADICAL HYSTERECTOMY AND SALPINGECTOMY;  Surgeon: Everitt Amber, MD;  Location: [facility];  Service: Gynecology;  Laterality: Bilateral;  . TUBAL LIGATION    . VAGINAL DELIVERY     1989    Past Medical Hx:  Past Medical History:  Diagnosis Date  . Cervical cancer ([place])   . Hemorrhoids   . Hives   . PONV (postoperative nausea and vomiting)   . Postmenopausal   .  Seasonal allergies   . Vitamin D deficiency     Family Hx:  Family History  Problem Relation Age of Onset  . Hypertension Mother   . Lupus Sister   . Cervical cancer Maternal Aunt   . Colon cancer Maternal Grandfather     Vitals:  Blood pressure (!) 123/51, pulse 61, temperature 97.9 F (36.6 C), temperature source Oral, resp. rate 20, height 5\' 4"  (1.626 m), weight 194 lb 8 oz (88.2 kg), SpO2 100 %.  Physical Exam:  General: Well developed, well nourished female in no acute distress. Alert and oriented x 3.  Cardiovascular: Regular rate and rhythm. S1 and S2 normal.  Lungs: Clear to auscultation bilaterally. No wheezes/crackles/rhonchi noted.  Skin: No rashes or lesions present. Back: Mild CVA tenderness.  Abdomen: Abdomen soft and non obese. Active bowel sounds in all quadrants. No evidence of a fluid wave or abdominal masses.  Soft abdomen and incisions. Tenderness to palpate LLQ, no masses Extremities: No bilateral cyanosis, edema, or clubbing.  Pelvic: vaginal cuff  smooth and regular, 1cm tongue of granulation tissue from left fornix, 1cm granulation tissue from right.  No masses or lesions.   Procedure Note:  Preop Dx: vaginal mass, history of cervical cancer Postop Dx: same Procedure: vaginal biopsy Surgeon: Dorann Ou, MD EBL: minimal Specimens: 1/ left vaginal fornix, 2/ right vaginal fornix Complications: none Procedure Details: The patient was provided verbal consent and verbal timeout was performed.  The speculum was inserted into the vagina and the vaginal apices were identified.  Tischler forcep was used to remove the fronds of granulation tissue from the left and right vaginal fornices.  Hemostasis was achieved with silver nitrate sticks.  The patient tolerated the procedure well.  Specimens were sent for histopathology.  [name], MD 09/25/2018, 3:22 PM

## 2018-09-25 NOTE — Patient Instructions (Signed)
Dr Denman George will see you back in May for follow-up.  She is ordering a CT scan to evaluate your left sided pain.  She biopsied some scar tissue that was bleeding from the top of the vagina. This will be resulted in 3-5 days and her office will contact you with the results.  Please call [phone] with questions.

## 2018-09-26 ENCOUNTER — Telehealth: Payer: Self-pay

## 2018-09-26 NOTE — Telephone Encounter (Signed)
Outgoing call to patient regarding recent surg path report 09/25/2018 - per [name] NP, "Healing tissue, no cancer" - pt voiced understanding and no other needs per her at this time.

## 2018-10-02 ENCOUNTER — Ambulatory Visit (HOSPITAL_COMMUNITY)
Admission: RE | Admit: 2018-10-02 | Discharge: 2018-10-02 | Disposition: A | Discharge status: Home / Self Care | Payer: 59 | Source: Ambulatory Visit | Attending: Gynecologic Oncology | Admitting: Gynecologic Oncology

## 2018-10-02 DIAGNOSIS — C539 Malignant neoplasm of cervix uteri, unspecified: Secondary | ICD-10-CM | POA: Diagnosis present

## 2018-10-02 DIAGNOSIS — M25552 Pain in left hip: Secondary | ICD-10-CM | POA: Insufficient documentation

## 2018-10-02 MED ORDER — SODIUM CHLORIDE (PF) 0.9 % IJ SOLN
INTRAMUSCULAR | Status: AC
  Filled 2018-10-02: qty 50

## 2018-10-02 MED ORDER — IOHEXOL 300 MG/ML  SOLN
100.0000 mL | Freq: Once | INTRAMUSCULAR | Status: AC | PRN
  Administered 2018-10-02: 100 mL via INTRAVENOUS

## 2018-10-03 ENCOUNTER — Encounter: Payer: Self-pay | Admitting: Gynecologic Oncology

## 2018-10-03 ENCOUNTER — Telehealth: Payer: Self-pay | Admitting: Gynecologic Oncology

## 2018-10-03 NOTE — Telephone Encounter (Signed)
Patient informed of CT scan results.  Discussed in detail.  All questions answered.  She is advised to call for any needs or concerns.

## 2018-10-30 ENCOUNTER — Ambulatory Visit: Payer: 59 | Admitting: Gynecologic Oncology

## 2018-11-13 IMAGING — PT NM PET TUM IMG INITIAL (PI) SKULL BASE T - THIGH
1 of 8 series · 1 of 25 positions shown · non-contrast
Comparison: None.

CLINICAL DATA: Subsequent treatment strategy for cervical cancer.

EXAM:
NUCLEAR MEDICINE PET SKULL BASE TO THIGH
TECHNIQUE: 8.8 mCi F-18 FDG was injected intravenously. Full-ring PET imaging
was performed from the skull base to thigh after the radiotracer. CT
data was obtained and used for attenuation correction and anatomic
localization.
Fasting blood glucose: 95 mg/dl

[Series 4: ct sk_thigh 5.0 b31f · axial · 5.0mm · 0.98mm/px · 1 of 229 slices shown]
[im 229/229  brain]
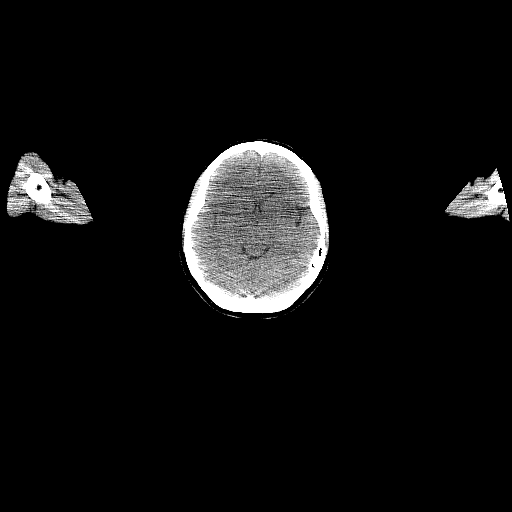

[1 of 25 positions shown; findings below may reference images not displayed]

FINDINGS: Mediastinal blood pool activity: SUV max

NECK: No hypermetabolic lymph nodes in the neck.

Incidental CT findings: none

CHEST: No hypermetabolic mediastinal or hilar nodes. No suspicious
pulmonary nodules on the CT scan.

Incidental CT findings: none

ABDOMEN/PELVIS: There is intense metabolic activity at the level of
the cervix with SUV max equal 25.

No hypermetabolic pelvic lymph nodes. Low activity associated with
the uterus body and ovaries is favored physiologic.

No hypermetabolic periaortic retroperitoneal nodes. No abnormal
activity in liver.

Incidental CT findings: RIGHT hepatic lobe relative low-density
lesion measures 14 mm (image 115/4). Favor benign lesions such as
hemangioma

SKELETON: No focal hypermetabolic activity to suggest skeletal
metastasis.

Incidental CT findings: none
IMPRESSION: 1. Intense hypermetabolic activity at the uterine cervix consistent
with cervical carcinoma.
2. No evidence of local nodal metastasis or distant metastatic
disease.
3. Relative low-density lesion in the liver cannot be fully
characterize however does not have metabolic activity and therefore
favored benign.

## 2018-12-18 ENCOUNTER — Telehealth: Payer: Self-pay | Admitting: *Deleted

## 2018-12-18 NOTE — Telephone Encounter (Signed)
Called and moved the appt from 5/27 to 6/8

## 2018-12-25 ENCOUNTER — Ambulatory Visit: Payer: 59 | Admitting: Gynecologic Oncology

## 2019-01-06 ENCOUNTER — Other Ambulatory Visit: Payer: Self-pay

## 2019-01-06 ENCOUNTER — Inpatient Hospital Stay: Payer: 59 | Attending: Gynecologic Oncology | Admitting: Gynecologic Oncology

## 2019-01-06 ENCOUNTER — Encounter: Payer: Self-pay | Admitting: Gynecologic Oncology

## 2019-01-06 VITALS — BP 119/59 | HR 66 | Temp 98.7°F | Resp 18 | Ht 64.0 in | Wt 196.4 lb

## 2019-01-06 DIAGNOSIS — Z08 Encounter for follow-up examination after completed treatment for malignant neoplasm: Secondary | ICD-10-CM | POA: Diagnosis present

## 2019-01-06 DIAGNOSIS — Z87891 Personal history of nicotine dependence: Secondary | ICD-10-CM | POA: Insufficient documentation

## 2019-01-06 DIAGNOSIS — E559 Vitamin D deficiency, unspecified: Secondary | ICD-10-CM | POA: Diagnosis not present

## 2019-01-06 DIAGNOSIS — Z8541 Personal history of malignant neoplasm of cervix uteri: Secondary | ICD-10-CM | POA: Diagnosis present

## 2019-01-06 DIAGNOSIS — C539 Malignant neoplasm of cervix uteri, unspecified: Secondary | ICD-10-CM

## 2019-01-06 DIAGNOSIS — Z90710 Acquired absence of both cervix and uterus: Secondary | ICD-10-CM | POA: Insufficient documentation

## 2019-01-06 DIAGNOSIS — Z90722 Acquired absence of ovaries, bilateral: Secondary | ICD-10-CM

## 2019-01-06 NOTE — Patient Instructions (Signed)
Please notify Dr Denman George at phone number [phone] if you notice vaginal bleeding, new pelvic or abdominal pains, bloating, feeling full easy, or a change in bladder or bowel function.   Please return to see Dr Denman George in September, 2020

## 2019-01-06 NOTE — Progress Notes (Signed)
Follow Up Note: Gyn-Onc  Regina Salinas 53 y.o. female  CC:  Chief Complaint  Patient presents with  . Malignant neoplasm of cervix, unspecified site [facility])   Assessment/Plan: 53 year old female s/p robotic-assisted type III radical laparoscopic hysterectomy with bilateral salpingectomy and bilateral pelvic lymphadenectomy with Dr. 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   Apparent low risk disease on final pathology. Adjuvant therapy not indicated according to NCCN guidelines.    Counseled about skin cancer risk with tanning.   HPI: Regina Salinas is a 53 year old female initially seen in consultation at the request of Dr. Radene Knee for adenocarcinoma of the endocervix and cervix. On January 23, 2018, she was seen by Dr. Radene Knee for her routine gynecologic examination.  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  For evaluation for the pap findings, she underwent a colposcopic examination of the cervix on February 07, 2018.  She did have acetowhite changes with slight punctation located on the left side of the cervix near the opening.  This was biopsied along with endocervical curettings taken.  The pathology from this colposcopy with biopsies revealed in the cervical biopsy at 3:00 adenocarcinoma, and in the endocervical curettage adenocarcinoma.  The comment and pathology was that both of the specimens show fragments of adenocarcinoma and while some of the features suggest an endometrial origin, differential includes endocervical and endometrial origin.  There is minimal stroma present in the tumor fragments which limits evaluation of invasion.  She denied postmenopausal bleeding, postcoital bleeding, abnormal discharge, or pelvic pain.  Her gynecologic history includes an endometrial ablation in approximately 2012 and has been amenorrheic  since that time.  There is also evidence of a benign endometrial biopsy in 2011.  She reports a personal history of an abnormal pap smear in 1990 that was treated with cryotherapy.  She had an additional abnormal Pap smear some years later however this was cleared with treatment for an infection.  She denies having abnormal Pap smears since that time.  There is evidence of a cytologically benign Pap smear in 2016.  HPV testing was not performed on that specimen.  The patient's family history is significant for a maternal grandmother with a history of colon cancer, and a maternal aunt with a history of cervical cancer.  On 02/21/18 she underwent cold knife conization of the cervix. Final pathology confirmed: 1. Cervix, cone - ENDOCERVICAL ADENOCARCINOMA, MODERATELY DIFFERENTIATED. - ECTOCERVICAL AND DEEP RESECTION MARGINS ARE POSITIVE, SEE COMMENT. - TUMOR SPANS AT LEAST 1.4 CM IN LENGTH AND 0.4 CM DEEP. 2. Endocervix, curettage, post cone - ENDOCERVICAL ADENOCARCINOMA. 3. Endometrium, curettage - ENDOCERVICAL ADENOCARCINOMA. Microscopic Comment 1. The sections are somewhat fragmented due to tumor involvement. The anterior sections display abundant tumor which spans at least 1.4 cm and involves the endocervical margin. The tumor invades at least 0.4 cm and involves the deep/radial margin. The ectocervical margin appears negative. Lymphovascular invasion is not seen.  On 03/11/18 she had a PET scan resulting: IMPRESSION: 1. Intense hypermetabolic activity at the uterine cervix consistent with cervical carcinoma. 2. No evidence of local nodal metastasis or distant metastatic Disease. 3. Relative low-density lesion in the liver cannot be fully characterize however does not have metabolic activity and therefore favored benign.  On 04/04/18, she underwent a robotic-assisted type III radical laparoscopic hysterectomy with bilateral salpingectomy and bilateral  pelvic lymphadenectomy with Dr. Everitt Amber.  Her post-operative course was positive for moderate POD1 nausea with decreased PO intake and mobility.  She was discharged on POD2.  Final pathology resulted: 1. Lymph node, biopsy, left external iliac - TWO OF TWO LYMPH NODES NEGATIVE FOR MALIGNANCY (0/2). 2. Lymph nodes, regional resection, right pelvic - NINE OF NINE LYMPH NODES NEGATIVE FOR CARCINOMA (0/9). 3. Lymph nodes, regional resection, left pelvic - FIVE OF FIVE LYMPH NODES NEGATIVE FOR CARCINOMA (0/5). 4. Uterus +/- tubes/ovaries, neoplastic, cervix, bilateral tubes - CERVIX: INVASIVE ADENOCARCINOMA, MODERATELY DIFFERENTIATED. TUMOR SPANS 2.5 CM WIDTH, 1.2 CM LENGTH AND 0.5 CM IN DEPTH. RESECTION MARGINS ARE NEGATIVE. SEE ONCOLOGY TABLE. - UTERUS: -ENDOMETRIUM: PROLIFERATIVE ENDOMETRIUM. NO HYPERPLASIA OR MALIGNANCY. -MYOMETRIUM: UNREMARKABLE. NO MALIGNANCY. -SEROSA: UNREMARKABLE. NO MALIGNANCY. 5. Vaginal mucosa, new true anterior margin - BENIGN SQUAMOUS MUCOSA  She had some postop voiding issues which have subsequently resolved completely.   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 She was prescribed cyclosporin for treatment. \ In February, 2020 she had been experiencing light pink vaginal spotting intermittently.  She has developed pain in the left lower quadrant - worse with pressure applied to abdomen. This was worked up by a vaginal cuff biopsy which showed granulation tissue and a CT abd/pelvis on 10/02/18 which showed no findings to explain her abdominal pains.   Interval History:  She has had no ongoing symptoms of abdominal pain or bleeding.  Review of Systems: Constitutional: Feels well. No fever, chills, early satiety, change in appetite.  Cardiovascular: No chest pain, shortness of breath, or edema.  Pulmonary: No cough or wheeze.  Gastrointestinal: No nausea, vomiting, or diarrhea. Positive for constipation. improved abdominal pain Genitourinary: No frequency,  urgency, or dysuria. no vaginal bleeding Musculoskeletal: No upper back pain. Neurologic: No weakness, numbness, or change in gait.  Psychology: No depression, anxiety, or insomnia.  Current Meds:  Outpatient Encounter Medications as of 01/06/2019  Medication Sig  . cetirizine (ZYRTEC) 10 MG tablet Take 10 mg by mouth 2 (two) times daily.  . Cholecalciferol (VITAMIN D) 2000 units CAPS Take 2,000 Units by mouth daily.   . cycloSPORINE modified (NEORAL) 50 MG capsule Take 50 mg by mouth 2 (two) times daily.   . famotidine (PEPCID) 40 MG tablet Take 40 mg by mouth 2 (two) times daily.  Marland Kitchen ibuprofen (ADVIL,MOTRIN) 800 MG tablet Take 1 tablet (800 mg total) by mouth every 6 (six) hours as needed.  . Levocetirizine Dihydrochloride (XYZAL PO) Take 5 mg by mouth daily.  . predniSONE (DELTASONE) 10 MG tablet Take 10 mg by mouth as needed. 1-2 times daily as needed    No facility-administered encounter medications on file as of 01/06/2019.     Allergy:  Allergies  Allergen Reactions  . Other Swelling and Other (See Comments)    Mild garlic  . Rocephin [Ceftriaxone Sodium In Dextrose] Hives    Social Hx:   Social History   Socioeconomic History  . Marital status: Married    Spouse name: Not on file  . Number of children: Not on file  . Years of education: Not on file  . Highest education level: Not on file  Occupational History  . Not on file  Social Needs  . Financial resource strain: Not on file  . Food insecurity:    Worry: Not on file    Inability: Not on file  . Transportation needs:    Medical: Not on file  Non-medical: Not on file  Tobacco Use  . Smoking status: Former Smoker    Packs/day: 0.00    Years: 25.00    Pack years: 0.00    Types: Cigarettes  . Smokeless tobacco: Never Used  . Tobacco comment: quit in June 2019  Substance and Sexual Activity  . Alcohol use: Yes    Comment: Ocassional  . Drug use: Never  . Sexual activity: Yes    Birth control/protection:  Post-menopausal  Lifestyle  . Physical activity:    Days per week: Not on file    Minutes per session: Not on file  . Stress: Not on file  Relationships  . Social connections:    Talks on phone: Not on file    Gets together: Not on file    Attends religious service: Not on file    Active member of club or organization: Not on file    Attends meetings of clubs or organizations: Not on file    Relationship status: Not on file  . Intimate partner violence:    Fear of current or ex partner: Not on file    Emotionally abused: Not on file    Physically abused: Not on file    Forced sexual activity: Not on file  Other Topics Concern  . Not on file  Social History Narrative  . Not on file    Past Surgical Hx:  Past Surgical History:  Procedure Laterality Date  . CERVICAL CONIZATION W/BX N/A 02/21/2018   Procedure: COLD KNIFE CONIZATION CERVIX;  Surgeon: Everitt Amber, MD;  Location: [facility];  Service: Gynecology;  Laterality: N/A;  . [place];  07-14-1999   dr Radene Knee [facility]   Bilateral Tubal Ligation w/ last one c/s  . DILATION AND CURETTAGE OF UTERUS N/A 02/21/2018   Procedure: DILATATION AND CURETTAGE OF THE UTERUS;  Surgeon: Everitt Amber, MD;  Location: [facility];  Service: Gynecology;  Laterality: N/A;  . ENDOMETRIAL ABLATION  2002  approx.    dr Radene Knee  . HEMORRHOID SURGERY  02-20-2008    dr Brantley Stage  [facility]  . PELVIC LYMPH NODE DISSECTION N/A 04/04/2018   Procedure: BILATERAL PELVIC LYMPHADENECTOMY;  Surgeon: Everitt Amber, MD;  Location: [facility];  Service: Gynecology;  Laterality: N/A;  . ROBOTIC ASSISTED LAPAROSCOPIC HYSTERECTOMY AND SALPINGECTOMY Bilateral 04/04/2018   Procedure: XI ROBOTIC ASSISTED RADICAL HYSTERECTOMY AND SALPINGECTOMY;  Surgeon: Everitt Amber, MD;  Location: [facility];  Service: Gynecology;  Laterality: Bilateral;  . TUBAL LIGATION    . VAGINAL DELIVERY     1989    Past Medical Hx:  Past Medical History:  Diagnosis Date  .  Cervical cancer ([place])   . Hemorrhoids   . Hives   . PONV (postoperative nausea and vomiting)   . Postmenopausal   . Seasonal allergies   . Vitamin D deficiency     Family Hx:  Family History  Problem Relation Age of Onset  . Hypertension Mother   . Lupus Sister   . Cervical cancer Maternal Aunt   . Colon cancer Maternal Grandfather     Vitals:  Blood pressure (!) 119/59, pulse 66, temperature 98.7 F (37.1 C), temperature source Oral, resp. rate 18, height 5\' 4"  (1.626 m), weight 196 lb 6.4 oz (89.1 kg), SpO2 100 %.  Physical Exam:  General: Well developed, well nourished female in no acute distress. Alert and oriented x 3.  Cardiovascular: Regular rate and rhythm. S1 and S2 normal.  Lungs: Clear to  auscultation bilaterally. No wheezes/crackles/rhonchi noted.  Skin: No rashes or lesions present. Back: Mild CVA tenderness.  Abdomen: Abdomen soft and non obese. Active bowel sounds in all quadrants. No evidence of a fluid wave or abdominal masses.  Soft abdomen and incisions. No tenderness to palpate LLQ, no masses Extremities: No bilateral cyanosis, edema, or clubbing.  Pelvic: vaginal cuff smooth and regular, no granulation tissue.  No masses or lesions.   [name], MD 01/06/2019, 2:40 PM

## 2019-04-14 ENCOUNTER — Other Ambulatory Visit (HOSPITAL_COMMUNITY)
Admission: RE | Admit: 2019-04-14 | Discharge: 2019-04-14 | Disposition: A | Discharge status: Home / Self Care | Payer: 59 | Source: Ambulatory Visit | Attending: Gynecologic Oncology | Admitting: Gynecologic Oncology

## 2019-04-14 ENCOUNTER — Other Ambulatory Visit: Payer: Self-pay

## 2019-04-14 ENCOUNTER — Inpatient Hospital Stay: Payer: 59 | Attending: Gynecologic Oncology | Admitting: Gynecologic Oncology

## 2019-04-14 VITALS — BP 115/72 | HR 59 | Temp 98.5°F | Resp 16 | Ht 63.0 in | Wt 191.0 lb

## 2019-04-14 DIAGNOSIS — C539 Malignant neoplasm of cervix uteri, unspecified: Secondary | ICD-10-CM | POA: Diagnosis present

## 2019-04-14 DIAGNOSIS — Z79899 Other long term (current) drug therapy: Secondary | ICD-10-CM | POA: Insufficient documentation

## 2019-04-14 DIAGNOSIS — Z90710 Acquired absence of both cervix and uterus: Secondary | ICD-10-CM | POA: Insufficient documentation

## 2019-04-14 DIAGNOSIS — Z8541 Personal history of malignant neoplasm of cervix uteri: Secondary | ICD-10-CM | POA: Diagnosis not present

## 2019-04-14 DIAGNOSIS — Z800 Family history of malignant neoplasm of digestive organs: Secondary | ICD-10-CM | POA: Diagnosis not present

## 2019-04-14 DIAGNOSIS — Z90722 Acquired absence of ovaries, bilateral: Secondary | ICD-10-CM | POA: Diagnosis not present

## 2019-04-14 DIAGNOSIS — Z08 Encounter for follow-up examination after completed treatment for malignant neoplasm: Secondary | ICD-10-CM | POA: Insufficient documentation

## 2019-04-14 DIAGNOSIS — E559 Vitamin D deficiency, unspecified: Secondary | ICD-10-CM | POA: Insufficient documentation

## 2019-04-14 DIAGNOSIS — Z87891 Personal history of nicotine dependence: Secondary | ICD-10-CM | POA: Diagnosis not present

## 2019-04-14 NOTE — Patient Instructions (Signed)
Dr Denman George took a pap today. Her office will contact you with the results. Please notify Dr Denman George at phone number [phone] if you notice vaginal bleeding, new pelvic or abdominal pains, bloating, feeling full easy, or a change in bladder or bowel function.   Please return to see her in 3 months as scheduled.

## 2019-04-14 NOTE — Progress Notes (Signed)
Follow Up Note: Gyn-Onc  Regina Salinas 53 y.o. female  CC:  Chief Complaint  Patient presents with  . Cervical Cancer    follow-up   Assessment/Plan: 53 year old female s/p robotic-assisted type III radical laparoscopic hysterectomy with bilateral salpingectomy and bilateral pelvic lymphadenectomy with Dr. 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   Apparent low risk disease on final pathology. Adjuvant therapy not indicated according to NCCN guidelines.    Pap taken today. If normal will repeat in September, 2020  I will see her again in 68months for surveillance.   HPI: Regina Salinas is a 53 year old female initially seen in consultation at the request of Dr. Radene Knee for adenocarcinoma of the endocervix and cervix. On January 23, 2018, she was seen by Dr. Radene Knee for her routine gynecologic examination.  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  For evaluation for the pap findings, she underwent a colposcopic examination of the cervix on February 07, 2018.  She did have acetowhite changes with slight punctation located on the left side of the cervix near the opening.  This was biopsied along with endocervical curettings taken.  The pathology from this colposcopy with biopsies revealed in the cervical biopsy at 3:00 adenocarcinoma, and in the endocervical curettage adenocarcinoma.  The comment and pathology was that both of the specimens show fragments of adenocarcinoma and while some of the features suggest an endometrial origin, differential includes endocervical and endometrial origin.  There is minimal stroma present in the tumor fragments which limits evaluation of invasion.  She denied postmenopausal bleeding, postcoital bleeding, abnormal discharge, or pelvic pain.  Her gynecologic history includes an endometrial ablation in  approximately 2012 and has been amenorrheic since that time.  There is also evidence of a benign endometrial biopsy in 2011.  She reports a personal history of an abnormal pap smear in 1990 that was treated with cryotherapy.  She had an additional abnormal Pap smear some years later however this was cleared with treatment for an infection.  She denies having abnormal Pap smears since that time.  There is evidence of a cytologically benign Pap smear in 2016.  HPV testing was not performed on that specimen.  The patient's family history is significant for a maternal grandmother with a history of colon cancer, and a maternal aunt with a history of cervical cancer.  On 02/21/18 she underwent cold knife conization of the cervix. Final pathology confirmed: 1. Cervix, cone - ENDOCERVICAL ADENOCARCINOMA, MODERATELY DIFFERENTIATED. - ECTOCERVICAL AND DEEP RESECTION MARGINS ARE POSITIVE, SEE COMMENT. - TUMOR SPANS AT LEAST 1.4 CM IN LENGTH AND 0.4 CM DEEP. 2. Endocervix, curettage, post cone - ENDOCERVICAL ADENOCARCINOMA. 3. Endometrium, curettage - ENDOCERVICAL ADENOCARCINOMA. Microscopic Comment 1. The sections are somewhat fragmented due to tumor involvement. The anterior sections display abundant tumor which spans at least 1.4 cm and involves the endocervical margin. The tumor invades at least 0.4 cm and involves the deep/radial margin. The ectocervical margin appears negative. Lymphovascular invasion is not seen.  On 03/11/18 she had a PET scan resulting: IMPRESSION: 1. Intense hypermetabolic activity at the uterine cervix consistent with cervical carcinoma. 2. No evidence of local nodal metastasis or distant metastatic Disease. 3. Relative low-density lesion in the liver cannot be fully characterize however does not have metabolic activity and therefore favored benign.  On 04/04/18, she underwent  a robotic-assisted type III radical laparoscopic hysterectomy with bilateral salpingectomy and bilateral  pelvic lymphadenectomy with Dr. Everitt Amber. Her post-operative course was positive for moderate POD1 nausea with decreased PO intake and mobility.  She was discharged on POD2.  Final pathology resulted: 1. Lymph node, biopsy, left external iliac - TWO OF TWO LYMPH NODES NEGATIVE FOR MALIGNANCY (0/2). 2. Lymph nodes, regional resection, right pelvic - NINE OF NINE LYMPH NODES NEGATIVE FOR CARCINOMA (0/9). 3. Lymph nodes, regional resection, left pelvic - FIVE OF FIVE LYMPH NODES NEGATIVE FOR CARCINOMA (0/5). 4. Uterus +/- tubes/ovaries, neoplastic, cervix, bilateral tubes - CERVIX: INVASIVE ADENOCARCINOMA, MODERATELY DIFFERENTIATED. TUMOR SPANS 2.5 CM WIDTH, 1.2 CM LENGTH AND 0.5 CM IN DEPTH. RESECTION MARGINS ARE NEGATIVE. SEE ONCOLOGY TABLE. - UTERUS: -ENDOMETRIUM: PROLIFERATIVE ENDOMETRIUM. NO HYPERPLASIA OR MALIGNANCY. -MYOMETRIUM: UNREMARKABLE. NO MALIGNANCY. -SEROSA: UNREMARKABLE. NO MALIGNANCY. 5. Vaginal mucosa, new true anterior margin - BENIGN SQUAMOUS MUCOSA  She had some postop voiding issues which have subsequently resolved completely.   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 She was prescribed cyclosporin for treatment. \ In February, 2020 she had been experiencing light pink vaginal spotting intermittently.  She has developed pain in the left lower quadrant - worse with pressure applied to abdomen. This was worked up by a vaginal cuff biopsy which showed granulation tissue and a CT abd/pelvis on 10/02/18 which showed no findings to explain her abdominal pains.   Interval History:  She has had no ongoing symptoms of abdominal pain or bleeding.  She has stopped cyclosporine which she was taking for her angioedema as this has resolved.   She is sexually active without complaints. 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   Review of Systems: Constitutional: Feels well. No fever, chills, early satiety,  change in appetite.  Cardiovascular: No chest pain, shortness of breath, or edema.  Pulmonary: No cough or wheeze.  Gastrointestinal: No nausea, vomiting, or diarrhea. Positive for constipation. improved abdominal pain Genitourinary: No frequency, urgency, or dysuria. no vaginal bleeding Musculoskeletal: No upper back pain. Neurologic: No weakness, numbness, or change in gait.  Psychology: No depression, anxiety, or insomnia.  Current Meds:  Outpatient Encounter Medications as of 04/14/2019  Medication Sig  . cetirizine (ZYRTEC) 10 MG tablet Take 10 mg by mouth 2 (two) times daily.  . Cholecalciferol (VITAMIN D) 2000 units CAPS Take 2,000 Units by mouth daily.   . cyanocobalamin 100 MCG tablet Take 100 mcg by mouth daily.  . predniSONE (DELTASONE) 10 MG tablet Take 10 mg by mouth as needed. 1-2 times daily as needed   . [DISCONTINUED] cycloSPORINE modified (NEORAL) 50 MG capsule Take 50 mg by mouth 2 (two) times daily.   . [DISCONTINUED] famotidine (PEPCID) 40 MG tablet Take 40 mg by mouth 2 (two) times daily.  . [DISCONTINUED] Levocetirizine Dihydrochloride (XYZAL PO) Take 5 mg by mouth daily.   No facility-administered encounter medications on file as of 04/14/2019.     Allergy:  Allergies  Allergen Reactions  . Other Swelling and Other (See Comments)    Mild garlic  . Rocephin [Ceftriaxone Sodium In Dextrose] Hives    Social Hx:   Social History   Socioeconomic History  . Marital status: Married    Spouse name: Not on file  . Number of children: Not on file  . Years of education: Not on file  . Highest education level: Not on file  Occupational History  . Not on file  Social Needs  . Financial resource strain: Not on file  . Food insecurity    Worry: Not on file    Inability: Not on file  . Transportation needs    Medical: Not on file    Non-medical: Not on file  Tobacco Use  . Smoking status: Former Smoker    Packs/day: 0.00    Years: 25.00    Pack years: 0.00     Types: Cigarettes  . Smokeless tobacco: Never Used  . Tobacco comment: quit in June 2019  Substance and Sexual Activity  . Alcohol use: Yes    Comment: Ocassional  . Drug use: Never  . Sexual activity: Yes    Birth control/protection: Post-menopausal  Lifestyle  . Physical activity    Days per week: Not on file    Minutes per session: Not on file  . Stress: Not on file  Relationships  . Social Herbalist on phone: Not on file    Gets together: Not on file    Attends religious service: Not on file    Active member of club or organization: Not on file    Attends meetings of clubs or organizations: Not on file    Relationship status: Not on file  . Intimate partner violence    Fear of current or ex partner: Not on file    Emotionally abused: Not on file    Physically abused: Not on file    Forced sexual activity: Not on file  Other Topics Concern  . Not on file  Social History Narrative  . Not on file    Past Surgical Hx:  Past Surgical History:  Procedure Laterality Date  . CERVICAL CONIZATION W/BX N/A 02/21/2018   Procedure: COLD KNIFE CONIZATION CERVIX;  Surgeon: Everitt Amber, MD;  Location: [facility];  Service: Gynecology;  Laterality: N/A;  . [place];  07-14-1999   dr Radene Knee [facility]   Bilateral Tubal Ligation w/ last one c/s  . DILATION AND CURETTAGE OF UTERUS N/A 02/21/2018   Procedure: DILATATION AND CURETTAGE OF THE UTERUS;  Surgeon: Everitt Amber, MD;  Location: [facility];  Service: Gynecology;  Laterality: N/A;  . ENDOMETRIAL ABLATION  2002  approx.    dr Radene Knee  . HEMORRHOID SURGERY  02-20-2008    dr Brantley Stage  [facility]  . PELVIC LYMPH NODE DISSECTION N/A 04/04/2018   Procedure: BILATERAL PELVIC LYMPHADENECTOMY;  Surgeon: Everitt Amber, MD;  Location: [facility];  Service: Gynecology;  Laterality: N/A;  . ROBOTIC ASSISTED LAPAROSCOPIC HYSTERECTOMY AND SALPINGECTOMY Bilateral 04/04/2018   Procedure: XI ROBOTIC ASSISTED RADICAL  HYSTERECTOMY AND SALPINGECTOMY;  Surgeon: Everitt Amber, MD;  Location: [facility];  Service: Gynecology;  Laterality: Bilateral;  . TUBAL LIGATION    . VAGINAL DELIVERY     1989    Past Medical Hx:  Past Medical History:  Diagnosis Date  . Cervical cancer ([place])   . Hemorrhoids   . Hives   . PONV (postoperative nausea and vomiting)   . Postmenopausal   . Seasonal allergies   . Vitamin D deficiency     Family Hx:  Family History  Problem Relation Age of Onset  . Hypertension Mother   . Lupus Sister   . Cervical cancer Maternal Aunt   . Colon cancer Maternal Grandfather     Vitals:  Blood pressure 115/72, pulse (!) 59, temperature 98.5 F (36.9 C), temperature source Temporal, resp. rate 16, height 5\' 3"  (1.6 m), weight  191 lb (86.6 kg), SpO2 100 %.  Physical Exam:  General: Well developed, well nourished female in no acute distress. Alert and oriented x 3.  Cardiovascular: Regular rate and rhythm. S1 and S2 normal.  Lungs: Clear to auscultation bilaterally. No wheezes/crackles/rhonchi noted.  Skin: No rashes or lesions present. Back: Mild CVA tenderness.  Abdomen: Abdomen soft and non obese. Active bowel sounds in all quadrants. No evidence of a fluid wave or abdominal masses.  Soft abdomen and incisions. No tenderness to palpate LLQ, no masses Extremities: No bilateral cyanosis, edema, or clubbing.  Pelvic: vaginal cuff smooth and regular, no granulation tissue.  No masses or lesions. Pap taken.   [name], MD 04/14/2019, 3:16 PM

## 2019-04-17 LAB — CYTOLOGY - PAP
Diagnosis: NEGATIVE
HPV: NOT DETECTED

## 2019-04-18 ENCOUNTER — Telehealth: Payer: Self-pay

## 2019-04-18 NOTE — Telephone Encounter (Signed)
I gave Regina Salinas her PAP smear results. She verbalized understanding

## 2019-07-04 ENCOUNTER — Telehealth: Payer: Self-pay | Admitting: *Deleted

## 2019-07-04 NOTE — Telephone Encounter (Signed)
Patient called and rescheduled her appt from December to January

## 2019-07-17 ENCOUNTER — Ambulatory Visit: Payer: 59 | Admitting: Gynecologic Oncology

## 2019-07-17 ENCOUNTER — Inpatient Hospital Stay: Payer: 59 | Attending: Gynecologic Oncology | Admitting: Gynecologic Oncology

## 2019-07-17 ENCOUNTER — Encounter: Payer: Self-pay | Admitting: Gynecologic Oncology

## 2019-07-17 ENCOUNTER — Other Ambulatory Visit: Payer: Self-pay

## 2019-07-17 VITALS — BP 118/57 | HR 50 | Temp 97.8°F | Resp 18 | Ht 64.0 in | Wt 193.0 lb

## 2019-07-17 DIAGNOSIS — Z90722 Acquired absence of ovaries, bilateral: Secondary | ICD-10-CM | POA: Diagnosis not present

## 2019-07-17 DIAGNOSIS — Z8541 Personal history of malignant neoplasm of cervix uteri: Secondary | ICD-10-CM

## 2019-07-17 DIAGNOSIS — Z90710 Acquired absence of both cervix and uterus: Secondary | ICD-10-CM | POA: Diagnosis not present

## 2019-07-17 DIAGNOSIS — Z800 Family history of malignant neoplasm of digestive organs: Secondary | ICD-10-CM | POA: Diagnosis not present

## 2019-07-17 DIAGNOSIS — Z87891 Personal history of nicotine dependence: Secondary | ICD-10-CM | POA: Insufficient documentation

## 2019-07-17 DIAGNOSIS — Z780 Asymptomatic menopausal state: Secondary | ICD-10-CM | POA: Diagnosis not present

## 2019-07-17 DIAGNOSIS — Z8049 Family history of malignant neoplasm of other genital organs: Secondary | ICD-10-CM | POA: Diagnosis not present

## 2019-07-17 DIAGNOSIS — C539 Malignant neoplasm of cervix uteri, unspecified: Secondary | ICD-10-CM

## 2019-07-17 NOTE — Patient Instructions (Signed)
Please notify Dr Alfretta Pinch at phone number [phone] if you notice vaginal bleeding, new pelvic or abdominal pains, bloating, feeling full easy, or a change in bladder or bowel function.   Please return to see Dr Walda Hertzog in 3 months. 

## 2019-07-17 NOTE — Progress Notes (Signed)
Follow Up Note: Gyn-Onc  Regina Salinas 53 y.o. female  CC:  Chief Complaint  Patient presents with  . Malignant neoplasm of cervix, unspecified site [facility])   Assessment/Plan: 53 year old female s/p robotic-assisted type III radical laparoscopic hysterectomy with bilateral salpingectomy and bilateral pelvic lymphadenectomy with Dr. 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   Apparent low risk disease on final pathology. Adjuvant therapy not indicated according to NCCN guidelines.    Will repeat pap in September, 2021  I will see her again in 28months for surveillance.   HPI: Regina Salinas is a 53 year old female initially seen in consultation at the request of Dr. Radene Knee for adenocarcinoma of the endocervix and cervix. On January 23, 2018, she was seen by Dr. Radene Knee for her routine gynecologic examination.  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  For evaluation for the pap findings, she underwent a colposcopic examination of the cervix on February 07, 2018.  She did have acetowhite changes with slight punctation located on the left side of the cervix near the opening.  This was biopsied along with endocervical curettings taken.  The pathology from this colposcopy with biopsies revealed in the cervical biopsy at 3:00 adenocarcinoma, and in the endocervical curettage adenocarcinoma.  The comment and pathology was that both of the specimens show fragments of adenocarcinoma and while some of the features suggest an endometrial origin, differential includes endocervical and endometrial origin.  There is minimal stroma present in the tumor fragments which limits evaluation of invasion.  She denied postmenopausal bleeding, postcoital bleeding, abnormal discharge, or pelvic pain.  Her gynecologic history includes an endometrial ablation in  approximately 2012 and has been amenorrheic since that time.  There is also evidence of a benign endometrial biopsy in 2011.  She reports a personal history of an abnormal pap smear in 1990 that was treated with cryotherapy.  She had an additional abnormal Pap smear some years later however this was cleared with treatment for an infection.  She denies having abnormal Pap smears since that time.  There is evidence of a cytologically benign Pap smear in 2016.  HPV testing was not performed on that specimen.  The patient's family history is significant for a maternal grandmother with a history of colon cancer, and a maternal aunt with a history of cervical cancer.  On 02/21/18 she underwent cold knife conization of the cervix. Final pathology confirmed: 1. Cervix, cone - ENDOCERVICAL ADENOCARCINOMA, MODERATELY DIFFERENTIATED. - ECTOCERVICAL AND DEEP RESECTION MARGINS ARE POSITIVE, SEE COMMENT. - TUMOR SPANS AT LEAST 1.4 CM IN LENGTH AND 0.4 CM DEEP. 2. Endocervix, curettage, post cone - ENDOCERVICAL ADENOCARCINOMA. 3. Endometrium, curettage - ENDOCERVICAL ADENOCARCINOMA. Microscopic Comment 1. The sections are somewhat fragmented due to tumor involvement. The anterior sections display abundant tumor which spans at least 1.4 cm and involves the endocervical margin. The tumor invades at least 0.4 cm and involves the deep/radial margin. The ectocervical margin appears negative. Lymphovascular invasion is not seen.  On 03/11/18 she had a PET scan resulting: IMPRESSION: 1. Intense hypermetabolic activity at the uterine cervix consistent with cervical carcinoma. 2. No evidence of local nodal metastasis or distant metastatic Disease. 3. Relative low-density lesion in the liver cannot be fully characterize however does not have metabolic activity and therefore favored benign.  On 04/04/18, she underwent a robotic-assisted type  III radical laparoscopic hysterectomy with bilateral salpingectomy and bilateral  pelvic lymphadenectomy with Dr. Everitt Amber. Her post-operative course was positive for moderate POD1 nausea with decreased PO intake and mobility.  She was discharged on POD2.  Final pathology resulted: 1. Lymph node, biopsy, left external iliac - TWO OF TWO LYMPH NODES NEGATIVE FOR MALIGNANCY (0/2). 2. Lymph nodes, regional resection, right pelvic - NINE OF NINE LYMPH NODES NEGATIVE FOR CARCINOMA (0/9). 3. Lymph nodes, regional resection, left pelvic - FIVE OF FIVE LYMPH NODES NEGATIVE FOR CARCINOMA (0/5). 4. Uterus +/- tubes/ovaries, neoplastic, cervix, bilateral tubes - CERVIX: INVASIVE ADENOCARCINOMA, MODERATELY DIFFERENTIATED. TUMOR SPANS 2.5 CM WIDTH, 1.2 CM LENGTH AND 0.5 CM IN DEPTH. RESECTION MARGINS ARE NEGATIVE. SEE ONCOLOGY TABLE. - UTERUS: -ENDOMETRIUM: PROLIFERATIVE ENDOMETRIUM. NO HYPERPLASIA OR MALIGNANCY. -MYOMETRIUM: UNREMARKABLE. NO MALIGNANCY. -SEROSA: UNREMARKABLE. NO MALIGNANCY. 5. Vaginal mucosa, new true anterior margin - BENIGN SQUAMOUS MUCOSA  She had some postop voiding issues which have subsequently resolved completely.   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 She was prescribed cyclosporin for treatment. \ In February, 2020 she had been experiencing light pink vaginal spotting intermittently.  She has developed pain in the left lower quadrant - worse with pressure applied to abdomen. This was worked up by a vaginal cuff biopsy which showed granulation tissue and a CT abd/pelvis on 10/02/18 which showed no findings to explain her abdominal pains.   Interval History:  She has had no ongoing symptoms of abdominal pain or bleeding.  She is sexually active without complaints. 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   She had a normal pap with negative high risk HPV in September, 2020.  Review of Systems: Constitutional: Feels well. No fever, chills, early satiety, change in appetite.   Cardiovascular: No chest pain, shortness of breath, or edema.  Pulmonary: No cough or wheeze.  Gastrointestinal: No nausea, vomiting, or diarrhea. Positive for constipation. improved abdominal pain Genitourinary: No frequency, urgency, or dysuria. no vaginal bleeding Musculoskeletal: No upper back pain. Neurologic: No weakness, numbness, or change in gait.  Psychology: No depression, anxiety, or insomnia.  Current Meds:  Outpatient Encounter Medications as of 07/17/2019  Medication Sig  . cetirizine (ZYRTEC) 10 MG tablet Take 10 mg by mouth 2 (two) times daily.  . Cholecalciferol (VITAMIN D) 2000 units CAPS Take 2,000 Units by mouth daily.   . cyanocobalamin 100 MCG tablet Take 100 mcg by mouth daily.  . [DISCONTINUED] predniSONE (DELTASONE) 10 MG tablet Take 10 mg by mouth as needed. 1-2 times daily as needed    No facility-administered encounter medications on file as of 07/17/2019.    Allergy:  Allergies  Allergen Reactions  . Other Swelling and Other (See Comments)    Mild garlic  . Rocephin [Ceftriaxone Sodium In Dextrose] Hives    Social Hx:   Social History   Socioeconomic History  . Marital status: Married    Spouse name: Not on file  . Number of children: Not on file  . Years of education: Not on file  . Highest education level: Not on file  Occupational History  . Not on file  Tobacco Use  . Smoking status: Former Smoker    Packs/day: 0.00    Years: 25.00    Pack years: 0.00    Types: Cigarettes  . Smokeless tobacco: Never Used  . Tobacco comment: quit in June 2019  Substance and Sexual Activity  . Alcohol use: Yes  Comment: Ocassional  . Drug use: Never  . Sexual activity: Yes    Birth control/protection: Post-menopausal  Other Topics Concern  . Not on file  Social History Narrative  . Not on file   Social Determinants of Health   Financial Resource Strain:   . Difficulty of Paying Living Expenses: Not on file  Food Insecurity:   . Worried  About Charity fundraiser in the Last Year: Not on file  . Ran Out of Food in the Last Year: Not on file  Transportation Needs:   . Lack of Transportation (Medical): Not on file  . Lack of Transportation (Non-Medical): Not on file  Physical Activity:   . Days of Exercise per Week: Not on file  . Minutes of Exercise per Session: Not on file  Stress:   . Feeling of Stress : Not on file  Social Connections:   . Frequency of Communication with Friends and Family: Not on file  . Frequency of Social Gatherings with Friends and Family: Not on file  . Attends Religious Services: Not on file  . Active Member of Clubs or Organizations: Not on file  . Attends Archivist Meetings: Not on file  . Marital Status: Not on file  Intimate Partner Violence:   . Fear of Current or Ex-Partner: Not on file  . Emotionally Abused: Not on file  . Physically Abused: Not on file  . Sexually Abused: Not on file    Past Surgical Hx:  Past Surgical History:  Procedure Laterality Date  . CERVICAL CONIZATION W/BX N/A 02/21/2018   Procedure: COLD KNIFE CONIZATION CERVIX;  Surgeon: Everitt Amber, MD;  Location: [facility];  Service: Gynecology;  Laterality: N/A;  . [place];  07-14-1999   dr Radene Knee [facility]   Bilateral Tubal Ligation w/ last one c/s  . DILATION AND CURETTAGE OF UTERUS N/A 02/21/2018   Procedure: DILATATION AND CURETTAGE OF THE UTERUS;  Surgeon: Everitt Amber, MD;  Location: [facility];  Service: Gynecology;  Laterality: N/A;  . ENDOMETRIAL ABLATION  2002  approx.    dr Radene Knee  . HEMORRHOID SURGERY  02-20-2008    dr Brantley Stage  [facility]  . PELVIC LYMPH NODE DISSECTION N/A 04/04/2018   Procedure: BILATERAL PELVIC LYMPHADENECTOMY;  Surgeon: Everitt Amber, MD;  Location: [facility];  Service: Gynecology;  Laterality: N/A;  . ROBOTIC ASSISTED LAPAROSCOPIC HYSTERECTOMY AND SALPINGECTOMY Bilateral 04/04/2018   Procedure: XI ROBOTIC ASSISTED RADICAL HYSTERECTOMY AND  SALPINGECTOMY;  Surgeon: Everitt Amber, MD;  Location: [facility];  Service: Gynecology;  Laterality: Bilateral;  . TUBAL LIGATION    . VAGINAL DELIVERY     1989    Past Medical Hx:  Past Medical History:  Diagnosis Date  . Cervical cancer ([place])   . Hemorrhoids   . Hives   . PONV (postoperative nausea and vomiting)   . Postmenopausal   . Seasonal allergies   . Vitamin D deficiency     Family Hx:  Family History  Problem Relation Age of Onset  . Hypertension Mother   . Lupus Sister   . Cervical cancer Maternal Aunt   . Colon cancer Maternal Grandfather     Vitals:  Blood pressure (!) 118/57, pulse (!) 50, temperature 97.8 F (36.6 C), resp. rate 18, height 5\' 4"  (1.626 m), weight 193 lb (87.5 kg), SpO2 100 %.  Physical Exam:  General: Well developed, well nourished female in no acute distress. Alert and oriented x 3.  Cardiovascular:  Regular rate and rhythm. S1 and S2 normal.  Lungs: Clear to auscultation bilaterally. No wheezes/crackles/rhonchi noted.  Skin: No rashes or lesions present. Back: Mild CVA tenderness.  Abdomen: Abdomen soft and non obese. Active bowel sounds in all quadrants. No evidence of a fluid wave or abdominal masses.  Soft abdomen and incisions. No tenderness to palpate LLQ, no masses Extremities: No bilateral cyanosis, edema, or clubbing.  Pelvic: vaginal cuff smooth and regular, no granulation tissue.  No masses or lesions.  [name], MD 07/17/2019, 5:32 PM

## 2019-08-19 ENCOUNTER — Ambulatory Visit: Payer: 59 | Admitting: Gynecologic Oncology

## 2019-10-14 ENCOUNTER — Inpatient Hospital Stay: Payer: 59 | Attending: Gynecologic Oncology | Admitting: Gynecologic Oncology

## 2019-10-14 ENCOUNTER — Encounter: Payer: Self-pay | Admitting: Gynecologic Oncology

## 2019-10-14 ENCOUNTER — Other Ambulatory Visit: Payer: Self-pay

## 2019-10-14 VITALS — BP 107/60 | HR 53 | Temp 97.8°F | Resp 18 | Ht 64.0 in | Wt 195.6 lb

## 2019-10-14 DIAGNOSIS — Z8049 Family history of malignant neoplasm of other genital organs: Secondary | ICD-10-CM | POA: Insufficient documentation

## 2019-10-14 DIAGNOSIS — Z90710 Acquired absence of both cervix and uterus: Secondary | ICD-10-CM | POA: Insufficient documentation

## 2019-10-14 DIAGNOSIS — Z9079 Acquired absence of other genital organ(s): Secondary | ICD-10-CM

## 2019-10-14 DIAGNOSIS — Z8249 Family history of ischemic heart disease and other diseases of the circulatory system: Secondary | ICD-10-CM | POA: Diagnosis not present

## 2019-10-14 DIAGNOSIS — Z8541 Personal history of malignant neoplasm of cervix uteri: Secondary | ICD-10-CM | POA: Diagnosis not present

## 2019-10-14 DIAGNOSIS — C539 Malignant neoplasm of cervix uteri, unspecified: Secondary | ICD-10-CM

## 2019-10-14 DIAGNOSIS — Z87891 Personal history of nicotine dependence: Secondary | ICD-10-CM | POA: Insufficient documentation

## 2019-10-14 DIAGNOSIS — Z800 Family history of malignant neoplasm of digestive organs: Secondary | ICD-10-CM | POA: Insufficient documentation

## 2019-10-14 DIAGNOSIS — Z90722 Acquired absence of ovaries, bilateral: Secondary | ICD-10-CM

## 2019-10-14 NOTE — Patient Instructions (Signed)
Please notify Dr Denman George at phone number [phone] if you notice vaginal bleeding, new pelvic or abdominal pains, bloating, feeling full easy, or a change in bladder or bowel function.   Please return to see Dr Denman George in May as scheduled.

## 2019-10-14 NOTE — Progress Notes (Signed)
Follow Up Note: Gyn-Onc  Regina Salinas 54 y.o. female  CC:  Chief Complaint  Patient presents with  . Malignant neoplasm of cervix, unspecified site [facility])    Follow up   Assessment/Plan: 54 year old female s/p robotic-assisted type III radical laparoscopic hysterectomy with bilateral salpingectomy and bilateral pelvic lymphadenectomy with Dr. 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   Apparent low risk disease on final pathology. Adjuvant therapy not indicated according to NCCN guidelines.    Will repeat pap in September, 2021  I will see her again in 54months for surveillance.   HPI: Regina Salinas is a 54 year old female initially seen in consultation at the request of Dr. Radene Knee for adenocarcinoma of the endocervix and cervix. On January 23, 2018, she was seen by Dr. Radene Knee for her routine gynecologic examination.  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  For evaluation for the pap findings, she underwent a colposcopic examination of the cervix on February 07, 2018.  She did have acetowhite changes with slight punctation located on the left side of the cervix near the opening.  This was biopsied along with endocervical curettings taken.  The pathology from this colposcopy with biopsies revealed in the cervical biopsy at 3:00 adenocarcinoma, and in the endocervical curettage adenocarcinoma.  The comment and pathology was that both of the specimens show fragments of adenocarcinoma and while some of the features suggest an endometrial origin, differential includes endocervical and endometrial origin.  There is minimal stroma present in the tumor fragments which limits evaluation of invasion.  She denied postmenopausal bleeding, postcoital bleeding, abnormal discharge, or pelvic pain.  Her gynecologic history includes an endometrial  ablation in approximately 2012 and has been amenorrheic since that time.  There is also evidence of a benign endometrial biopsy in 2011.  She reports a personal history of an abnormal pap smear in 1990 that was treated with cryotherapy.  She had an additional abnormal Pap smear some years later however this was cleared with treatment for an infection.  She denies having abnormal Pap smears since that time.  There is evidence of a cytologically benign Pap smear in 2016.  HPV testing was not performed on that specimen.  The patient's family history is significant for a maternal grandmother with a history of colon cancer, and a maternal aunt with a history of cervical cancer.  On 02/21/18 she underwent cold knife conization of the cervix. Final pathology confirmed: 1. Cervix, cone - ENDOCERVICAL ADENOCARCINOMA, MODERATELY DIFFERENTIATED. - ECTOCERVICAL AND DEEP RESECTION MARGINS ARE POSITIVE, SEE COMMENT. - TUMOR SPANS AT LEAST 1.4 CM IN LENGTH AND 0.4 CM DEEP. 2. Endocervix, curettage, post cone - ENDOCERVICAL ADENOCARCINOMA. 3. Endometrium, curettage - ENDOCERVICAL ADENOCARCINOMA. Microscopic Comment 1. The sections are somewhat fragmented due to tumor involvement. The anterior sections display abundant tumor which spans at least 1.4 cm and involves the endocervical margin. The tumor invades at least 0.4 cm and involves the deep/radial margin. The ectocervical margin appears negative. Lymphovascular invasion is not seen.  On 03/11/18 she had a PET scan resulting: IMPRESSION: 1. Intense hypermetabolic activity at the uterine cervix consistent with cervical carcinoma. 2. No evidence of local nodal metastasis or distant metastatic Disease. 3. Relative low-density lesion in the liver cannot be fully characterize however does not have metabolic activity and therefore favored benign.  On 04/04/18,  she underwent a robotic-assisted type III radical laparoscopic hysterectomy with bilateral salpingectomy and  bilateral pelvic lymphadenectomy with Dr. Everitt Amber. Her post-operative course was positive for moderate POD1 nausea with decreased PO intake and mobility.  She was discharged on POD2.  Final pathology resulted: 1. Lymph node, biopsy, left external iliac - TWO OF TWO LYMPH NODES NEGATIVE FOR MALIGNANCY (0/2). 2. Lymph nodes, regional resection, right pelvic - NINE OF NINE LYMPH NODES NEGATIVE FOR CARCINOMA (0/9). 3. Lymph nodes, regional resection, left pelvic - FIVE OF FIVE LYMPH NODES NEGATIVE FOR CARCINOMA (0/5). 4. Uterus +/- tubes/ovaries, neoplastic, cervix, bilateral tubes - CERVIX: INVASIVE ADENOCARCINOMA, MODERATELY DIFFERENTIATED. TUMOR SPANS 2.5 CM WIDTH, 1.2 CM LENGTH AND 0.5 CM IN DEPTH. RESECTION MARGINS ARE NEGATIVE. SEE ONCOLOGY TABLE. - UTERUS: -ENDOMETRIUM: PROLIFERATIVE ENDOMETRIUM. NO HYPERPLASIA OR MALIGNANCY. -MYOMETRIUM: UNREMARKABLE. NO MALIGNANCY. -SEROSA: UNREMARKABLE. NO MALIGNANCY. 5. Vaginal mucosa, new true anterior margin - BENIGN SQUAMOUS MUCOSA  She had some postop voiding issues which have subsequently resolved completely.   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 She was prescribed cyclosporin for treatment. \ In February, 2020 she had been experiencing light pink vaginal spotting intermittently.  She has developed pain in the left lower quadrant - worse with pressure applied to abdomen. This was worked up by a vaginal cuff biopsy which showed granulation tissue and a CT abd/pelvis on 10/02/18 which showed no findings to explain her abdominal pains.  Interval History:  She had a normal pap with negative high risk HPV in September, 2020.  In March, 2021 she had an episode of bright red vaginal bleeding at the time of a hard bowel movement. It resolved and the she had no further episodes. She denied bleeding with intercourse.  Review of Systems: Constitutional: Feels well. No fever, chills, early satiety,  change in appetite.  Cardiovascular: No chest pain, shortness of breath, or edema.  Pulmonary: No cough or wheeze.  Gastrointestinal: No nausea, vomiting, or diarrhea. Positive for constipation. improved abdominal pain Genitourinary: No frequency, urgency, or dysuria. no vaginal bleeding Musculoskeletal: No upper back pain. Neurologic: No weakness, numbness, or change in gait.  Psychology: No depression, anxiety, or insomnia.  Current Meds:  Outpatient Encounter Medications as of 10/14/2019  Medication Sig  . cetirizine (ZYRTEC) 10 MG tablet Take 10 mg by mouth 2 (two) times daily.  . Cholecalciferol (VITAMIN D) 2000 units CAPS Take 2,000 Units by mouth daily.   . cyanocobalamin 100 MCG tablet Take 100 mcg by mouth daily.  . penicillin v potassium (VEETID) 500 MG tablet Take 500 mg by mouth 2 (two) times daily.  . predniSONE (STERAPRED UNI-PAK 21 TAB) 10 MG (21) TBPK tablet    No facility-administered encounter medications on file as of 10/14/2019.    Allergy:  Allergies  Allergen Reactions  . Other Swelling and Other (See Comments)    Mild garlic  . Rocephin [Ceftriaxone Sodium In Dextrose] Hives    Social Hx:   Social History   Socioeconomic History  . Marital status: Married    Spouse name: Not on file  . Number of children: Not on file  . Years of education: Not on file  . Highest education level: Not on file  Occupational History  . Not on file  Tobacco Use  . Smoking status: Former Smoker    Packs/day: 0.00    Years: 25.00    Pack years: 0.00    Types: Cigarettes  . Smokeless tobacco: Never Used  .  Tobacco comment: quit in June 2019  Substance and Sexual Activity  . Alcohol use: Yes    Comment: Ocassional  . Drug use: Never  . Sexual activity: Yes    Birth control/protection: Post-menopausal  Other Topics Concern  . Not on file  Social History Narrative  . Not on file   Social Determinants of Health   Financial Resource Strain:   . Difficulty of  Paying Living Expenses:   Food Insecurity:   . Worried About Charity fundraiser in the Last Year:   . Arboriculturist in the Last Year:   Transportation Needs:   . Film/video editor (Medical):   Marland Kitchen Lack of Transportation (Non-Medical):   Physical Activity:   . Days of Exercise per Week:   . Minutes of Exercise per Session:   Stress:   . Feeling of Stress :   Social Connections:   . Frequency of Communication with Friends and Family:   . Frequency of Social Gatherings with Friends and Family:   . Attends Religious Services:   . Active Member of Clubs or Organizations:   . Attends Archivist Meetings:   Marland Kitchen Marital Status:   Intimate Partner Violence:   . Fear of Current or Ex-Partner:   . Emotionally Abused:   Marland Kitchen Physically Abused:   . Sexually Abused:     Past Surgical Hx:  Past Surgical History:  Procedure Laterality Date  . CERVICAL CONIZATION W/BX N/A 02/21/2018   Procedure: COLD KNIFE CONIZATION CERVIX;  Surgeon: Everitt Amber, MD;  Location: [facility];  Service: Gynecology;  Laterality: N/A;  . [place];  07-14-1999   dr Radene Knee [facility]   Bilateral Tubal Ligation w/ last one c/s  . DILATION AND CURETTAGE OF UTERUS N/A 02/21/2018   Procedure: DILATATION AND CURETTAGE OF THE UTERUS;  Surgeon: Everitt Amber, MD;  Location: [facility];  Service: Gynecology;  Laterality: N/A;  . ENDOMETRIAL ABLATION  2002  approx.    dr Radene Knee  . HEMORRHOID SURGERY  02-20-2008    dr Brantley Stage  [facility]  . PELVIC LYMPH NODE DISSECTION N/A 04/04/2018   Procedure: BILATERAL PELVIC LYMPHADENECTOMY;  Surgeon: Everitt Amber, MD;  Location: [facility];  Service: Gynecology;  Laterality: N/A;  . ROBOTIC ASSISTED LAPAROSCOPIC HYSTERECTOMY AND SALPINGECTOMY Bilateral 04/04/2018   Procedure: XI ROBOTIC ASSISTED RADICAL HYSTERECTOMY AND SALPINGECTOMY;  Surgeon: Everitt Amber, MD;  Location: [facility];  Service: Gynecology;  Laterality: Bilateral;  . TUBAL LIGATION    . VAGINAL  DELIVERY     1989    Past Medical Hx:  Past Medical History:  Diagnosis Date  . Cervical cancer ([place])   . Hemorrhoids   . Hives   . PONV (postoperative nausea and vomiting)   . Postmenopausal   . Seasonal allergies   . Vitamin D deficiency     Family Hx:  Family History  Problem Relation Age of Onset  . Hypertension Mother   . Lupus Sister   . Cervical cancer Maternal Aunt   . Colon cancer Maternal Grandfather     Vitals:  Blood pressure 107/60, pulse (!) 53, temperature 97.8 F (36.6 C), temperature source Temporal, resp. rate 18, height 5\' 4"  (1.626 m), weight 195 lb 9.6 oz (88.7 kg), SpO2 99 %.  Physical Exam:  General: Well developed, well nourished female in no acute distress. Alert and oriented x 3.  Cardiovascular: Regular rate and rhythm. S1 and S2 normal.  Lungs: Clear to auscultation  bilaterally. No wheezes/crackles/rhonchi noted.  Skin: No rashes or lesions present. Back: Mild CVA tenderness.  Abdomen: Abdomen soft and non obese. Active bowel sounds in all quadrants. No evidence of a fluid wave or abdominal masses.  Soft abdomen and incisions. No tenderness to palpate LLQ, no masses Extremities: No bilateral cyanosis, edema, or clubbing.  Pelvic: vaginal cuff smooth and regular, no granulation tissue.  No masses or lesions. Rectal: no masses or lesions.  [name], MD 10/14/2019, 2:40 PM

## 2019-12-09 ENCOUNTER — Inpatient Hospital Stay: Payer: 59 | Attending: Gynecologic Oncology | Admitting: Gynecologic Oncology

## 2019-12-09 ENCOUNTER — Encounter: Payer: Self-pay | Admitting: Gynecologic Oncology

## 2019-12-09 ENCOUNTER — Other Ambulatory Visit: Payer: Self-pay

## 2019-12-09 VITALS — BP 119/57 | HR 57 | Temp 98.0°F | Resp 16 | Ht 64.0 in | Wt 197.4 lb

## 2019-12-09 DIAGNOSIS — Z8541 Personal history of malignant neoplasm of cervix uteri: Secondary | ICD-10-CM | POA: Insufficient documentation

## 2019-12-09 DIAGNOSIS — Z08 Encounter for follow-up examination after completed treatment for malignant neoplasm: Secondary | ICD-10-CM | POA: Diagnosis present

## 2019-12-09 DIAGNOSIS — Z90710 Acquired absence of both cervix and uterus: Secondary | ICD-10-CM | POA: Diagnosis not present

## 2019-12-09 DIAGNOSIS — Z9079 Acquired absence of other genital organ(s): Secondary | ICD-10-CM | POA: Insufficient documentation

## 2019-12-09 DIAGNOSIS — C539 Malignant neoplasm of cervix uteri, unspecified: Secondary | ICD-10-CM

## 2019-12-09 NOTE — Patient Instructions (Signed)
Please notify Dr Denman George at phone number [phone] if you notice vaginal bleeding, new pelvic or abdominal pains, bloating, feeling full easy, or a change in bladder or bowel function.   Please return to see Dr Denman George in 3 months for a pap smear.

## 2019-12-09 NOTE — Progress Notes (Signed)
Follow Up Note: Gyn-Onc  Regina Salinas 54 y.o. female  CC:  Chief Complaint  Patient presents with  . Malignant neoplasm of cervix, unspecified site [facility])    Follow Up   Assessment/Plan: 54 year old female s/p robotic-assisted type III radical laparoscopic hysterectomy with bilateral salpingectomy and bilateral pelvic lymphadenectomy with Dr. 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   Apparent low risk disease on final pathology. Adjuvant therapy not indicated according to NCCN guidelines.    Will repeat pap at her next visit.  I will see her again in 38months for surveillance. At that time if exam is normal, we will transition to 6 monthly checks.   HPI: Regina Salinas is a 54 year old female initially seen in consultation at the request of Dr. Radene Knee for adenocarcinoma of the endocervix and cervix. On January 23, 2018, she was seen by Dr. Radene Knee for her routine gynecologic examination.  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  For evaluation for the pap findings, she underwent a colposcopic examination of the cervix on February 07, 2018.  She did have acetowhite changes with slight punctation located on the left side of the cervix near the opening.  This was biopsied along with endocervical curettings taken.  The pathology from this colposcopy with biopsies revealed in the cervical biopsy at 3:00 adenocarcinoma, and in the endocervical curettage adenocarcinoma.  The comment and pathology was that both of the specimens show fragments of adenocarcinoma and while some of the features suggest an endometrial origin, differential includes endocervical and endometrial origin.  There is minimal stroma present in the tumor fragments which limits evaluation of invasion.  She denied postmenopausal bleeding, postcoital bleeding, abnormal  discharge, or pelvic pain.  Her gynecologic history includes an endometrial ablation in approximately 2012 and has been amenorrheic since that time.  There is also evidence of a benign endometrial biopsy in 2011.  She reports a personal history of an abnormal pap smear in 1990 that was treated with cryotherapy.  She had an additional abnormal Pap smear some years later however this was cleared with treatment for an infection.  She denies having abnormal Pap smears since that time.  There is evidence of a cytologically benign Pap smear in 2016.  HPV testing was not performed on that specimen.  The patient's family history is significant for a maternal grandmother with a history of colon cancer, and a maternal aunt with a history of cervical cancer.  On 02/21/18 she underwent cold knife conization of the cervix. Final pathology confirmed: 1. Cervix, cone - ENDOCERVICAL ADENOCARCINOMA, MODERATELY DIFFERENTIATED. - ECTOCERVICAL AND DEEP RESECTION MARGINS ARE POSITIVE, SEE COMMENT. - TUMOR SPANS AT LEAST 1.4 CM IN LENGTH AND 0.4 CM DEEP. 2. Endocervix, curettage, post cone - ENDOCERVICAL ADENOCARCINOMA. 3. Endometrium, curettage - ENDOCERVICAL ADENOCARCINOMA. Microscopic Comment 1. The sections are somewhat fragmented due to tumor involvement. The anterior sections display abundant tumor which spans at least 1.4 cm and involves the endocervical margin. The tumor invades at least 0.4 cm and involves the deep/radial margin. The ectocervical margin appears negative. Lymphovascular invasion is not seen.  On 03/11/18 she had a PET scan resulting: IMPRESSION: 1. Intense hypermetabolic activity at the uterine cervix consistent with cervical carcinoma. 2. No evidence of local nodal metastasis or distant metastatic Disease. 3. Relative low-density lesion in the liver cannot be  fully characterize however does not have metabolic activity and therefore favored benign.  On 04/04/18, she underwent a robotic-assisted  type III radical laparoscopic hysterectomy with bilateral salpingectomy and bilateral pelvic lymphadenectomy with Dr. Everitt Amber. Her post-operative course was positive for moderate POD1 nausea with decreased PO intake and mobility.  She was discharged on POD2.  Final pathology resulted: 1. Lymph node, biopsy, left external iliac - TWO OF TWO LYMPH NODES NEGATIVE FOR MALIGNANCY (0/2). 2. Lymph nodes, regional resection, right pelvic - NINE OF NINE LYMPH NODES NEGATIVE FOR CARCINOMA (0/9). 3. Lymph nodes, regional resection, left pelvic - FIVE OF FIVE LYMPH NODES NEGATIVE FOR CARCINOMA (0/5). 4. Uterus +/- tubes/ovaries, neoplastic, cervix, bilateral tubes - CERVIX: INVASIVE ADENOCARCINOMA, MODERATELY DIFFERENTIATED. TUMOR SPANS 2.5 CM WIDTH, 1.2 CM LENGTH AND 0.5 CM IN DEPTH. RESECTION MARGINS ARE NEGATIVE. SEE ONCOLOGY TABLE. - UTERUS: -ENDOMETRIUM: PROLIFERATIVE ENDOMETRIUM. NO HYPERPLASIA OR MALIGNANCY. -MYOMETRIUM: UNREMARKABLE. NO MALIGNANCY. -SEROSA: UNREMARKABLE. NO MALIGNANCY. 5. Vaginal mucosa, new true anterior margin - BENIGN SQUAMOUS MUCOSA  She had some postop voiding issues which have subsequently resolved completely.   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 She was prescribed cyclosporin for treatment. \ In February, 2020 she had been experiencing light pink vaginal spotting intermittently.  She has developed pain in the left lower quadrant - worse with pressure applied to abdomen. This was worked up by a vaginal cuff biopsy which showed granulation tissue and a CT abd/pelvis on 10/02/18 which showed no findings to explain her abdominal pains.  Interval History:  She had a normal pap with negative high risk HPV in September, 2020.  In March, 2021 she had an episode of bright red vaginal bleeding at the time of a hard bowel movement. It resolved and the she had no further episodes. She denied bleeding with intercourse. Exam  with me showed no evidence of recurrence.   Review of Systems: Constitutional: Feels well. No fever, chills, early satiety, change in appetite.  Cardiovascular: No chest pain, shortness of breath, or edema.  Pulmonary: No cough or wheeze.  Gastrointestinal: No nausea, vomiting, or diarrhea. Positive for constipation. improved abdominal pain Genitourinary: No frequency, urgency, or dysuria. no vaginal bleeding Musculoskeletal: No upper back pain. Neurologic: No weakness, numbness, or change in gait.  Psychology: No depression, anxiety, or insomnia.  Current Meds:  Outpatient Encounter Medications as of 12/09/2019  Medication Sig  . cetirizine (ZYRTEC) 10 MG tablet Take 10 mg by mouth 2 (two) times daily.  . Cholecalciferol (VITAMIN D) 2000 units CAPS Take 2,000 Units by mouth daily.   . [DISCONTINUED] cyanocobalamin 100 MCG tablet Take 100 mcg by mouth daily.  . [DISCONTINUED] penicillin v potassium (VEETID) 500 MG tablet Take 500 mg by mouth 2 (two) times daily.  . [DISCONTINUED] predniSONE (STERAPRED UNI-PAK 21 TAB) 10 MG (21) TBPK tablet    No facility-administered encounter medications on file as of 12/09/2019.    Allergy:  Allergies  Allergen Reactions  . Other Swelling and Other (See Comments)    Mild garlic  . Rocephin [Ceftriaxone Sodium In Dextrose] Hives    Social Hx:   Social History   Socioeconomic History  . Marital status: Married    Spouse name: Not on file  . Number of children: Not on file  . Years of education: Not on file  . Highest education level: Not on file  Occupational History  . Not on file  Tobacco Use  . Smoking status: Former Smoker  Packs/day: 0.00    Years: 25.00    Pack years: 0.00    Types: Cigarettes  . Smokeless tobacco: Never Used  . Tobacco comment: quit in June 2019  Substance and Sexual Activity  . Alcohol use: Yes    Comment: Ocassional  . Drug use: Never  . Sexual activity: Yes    Birth control/protection: Post-menopausal   Other Topics Concern  . Not on file  Social History Narrative  . Not on file   Social Determinants of Health   Financial Resource Strain:   . Difficulty of Paying Living Expenses:   Food Insecurity:   . Worried About Charity fundraiser in the Last Year:   . Arboriculturist in the Last Year:   Transportation Needs:   . Film/video editor (Medical):   Marland Kitchen Lack of Transportation (Non-Medical):   Physical Activity:   . Days of Exercise per Week:   . Minutes of Exercise per Session:   Stress:   . Feeling of Stress :   Social Connections:   . Frequency of Communication with Friends and Family:   . Frequency of Social Gatherings with Friends and Family:   . Attends Religious Services:   . Active Member of Clubs or Organizations:   . Attends Archivist Meetings:   Marland Kitchen Marital Status:   Intimate Partner Violence:   . Fear of Current or Ex-Partner:   . Emotionally Abused:   Marland Kitchen Physically Abused:   . Sexually Abused:     Past Surgical Hx:  Past Surgical History:  Procedure Laterality Date  . CERVICAL CONIZATION W/BX N/A 02/21/2018   Procedure: COLD KNIFE CONIZATION CERVIX;  Surgeon: Everitt Amber, MD;  Location[facility];  Service: Gynecology;  Laterality: N/A;  . [place];  07-14-1999   dr Radene Knee [facility]   Bilateral Tubal Ligation w/ last one c/s  . DILATION AND CURETTAGE OF UTERUS N/A 02/21/2018   Procedure: DILATATION AND CURETTAGE OF THE UTERUS;  Surgeon: Everitt Amber, MD;  Location: [facility];  Service: Gynecology;  Laterality: N/A;  . ENDOMETRIAL ABLATION  2002  approx.    dr Radene Knee  . HEMORRHOID SURGERY  02-20-2008    dr Brantley Stage  [facility]  . PELVIC LYMPH NODE DISSECTION N/A 04/04/2018   Procedure: BILATERAL PELVIC LYMPHADENECTOMY;  Surgeon: Everitt Amber, MD;  Location: [facility];  Service: Gynecology;  Laterality: N/A;  . ROBOTIC ASSISTED LAPAROSCOPIC HYSTERECTOMY AND SALPINGECTOMY Bilateral 04/04/2018   Procedure: XI ROBOTIC ASSISTED  RADICAL HYSTERECTOMY AND SALPINGECTOMY;  Surgeon: Everitt Amber, MD;  Location: [facility];  Service: Gynecology;  Laterality: Bilateral;  . TUBAL LIGATION    . VAGINAL DELIVERY     1989    Past Medical Hx:  Past Medical History:  Diagnosis Date  . Cervical cancer ([employer])   . Hemorrhoids   . Hives   . PONV (postoperative nausea and vomiting)   . Postmenopausal   . Seasonal allergies   . Vitamin D deficiency     Family Hx:  Family History  Problem Relation Age of Onset  . Hypertension Mother   . Lupus Sister   . Cervical cancer Maternal Aunt   . Colon cancer Maternal Grandfather     Vitals:  Blood pressure (!) 119/57, pulse (!) 57, temperature 98 F (36.7 C), temperature source Temporal, resp. rate 16, height 5\' 4"  (1.626 m), weight 197 lb 6.4 oz (89.5 kg), SpO2 100 %.  Physical Exam:  General: Well developed,  well nourished female in no acute distress. Alert and oriented x 3.  Cardiovascular: Regular rate and rhythm. S1 and S2 normal.  Lungs: Clear to auscultation bilaterally. No wheezes/crackles/rhonchi noted.  Skin: No rashes or lesions present. Back: Mild CVA tenderness.  Abdomen: Abdomen soft and non obese. Active bowel sounds in all quadrants. No evidence of a fluid wave or abdominal masses.  Soft abdomen and incisions. No tenderness to palpate LLQ, no masses Extremities: No bilateral cyanosis, edema, or clubbing.  Pelvic: vaginal cuff smooth and regular, no granulation tissue.  No masses or lesions. Rectal: no masses or lesions.  [name], MD 12/09/2019, 2:22 PM

## 2020-04-19 ENCOUNTER — Other Ambulatory Visit (HOSPITAL_COMMUNITY)
Admission: RE | Admit: 2020-04-19 | Discharge: 2020-04-19 | Disposition: A | Discharge status: Home / Self Care | Payer: Self-pay | Source: Ambulatory Visit | Attending: Gynecologic Oncology | Admitting: Gynecologic Oncology

## 2020-04-19 ENCOUNTER — Encounter: Payer: Self-pay | Admitting: Gynecologic Oncology

## 2020-04-19 ENCOUNTER — Inpatient Hospital Stay: Payer: Self-pay | Attending: Gynecologic Oncology | Admitting: Gynecologic Oncology

## 2020-04-19 ENCOUNTER — Other Ambulatory Visit: Payer: Self-pay

## 2020-04-19 VITALS — BP 104/56 | HR 52 | Temp 98.1°F | Resp 16 | Ht 64.0 in | Wt 200.2 lb

## 2020-04-19 DIAGNOSIS — Z9079 Acquired absence of other genital organ(s): Secondary | ICD-10-CM | POA: Insufficient documentation

## 2020-04-19 DIAGNOSIS — Z8541 Personal history of malignant neoplasm of cervix uteri: Secondary | ICD-10-CM | POA: Insufficient documentation

## 2020-04-19 DIAGNOSIS — Z8249 Family history of ischemic heart disease and other diseases of the circulatory system: Secondary | ICD-10-CM | POA: Insufficient documentation

## 2020-04-19 DIAGNOSIS — C539 Malignant neoplasm of cervix uteri, unspecified: Secondary | ICD-10-CM

## 2020-04-19 DIAGNOSIS — Z800 Family history of malignant neoplasm of digestive organs: Secondary | ICD-10-CM | POA: Insufficient documentation

## 2020-04-19 DIAGNOSIS — Z90710 Acquired absence of both cervix and uterus: Secondary | ICD-10-CM | POA: Insufficient documentation

## 2020-04-19 DIAGNOSIS — Z87891 Personal history of nicotine dependence: Secondary | ICD-10-CM | POA: Insufficient documentation

## 2020-04-19 NOTE — Progress Notes (Signed)
Follow Up Note: Gyn-Onc  Regina Salinas 54 y.o. female  CC:  Chief Complaint  Patient presents with  . Cervical Cancer    Follow up   Assessment/Plan: 54 year old female s/p robotic-assisted type III radical laparoscopic hysterectomy with bilateral salpingectomy and bilateral pelvic lymphadenectomy with Dr. 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   Apparent low risk disease on final pathology. Adjuvant therapy not indicated according to NCCN guidelines.    Will repeat pap at her September visits.  I will see her again in 6 months for surveillance.  HPI: Regina Salinas is a 54 year old female initially seen in consultation at the request of Dr. Radene Knee for adenocarcinoma of the endocervix and cervix. On January 23, 2018, she was seen by Dr. Radene Knee for her routine gynecologic examination.  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  For evaluation for the pap findings, she underwent a colposcopic examination of the cervix on February 07, 2018.  She did have acetowhite changes with slight punctation located on the left side of the cervix near the opening.  This was biopsied along with endocervical curettings taken.  The pathology from this colposcopy with biopsies revealed in the cervical biopsy at 3:00 adenocarcinoma, and in the endocervical curettage adenocarcinoma.  The comment and pathology was that both of the specimens show fragments of adenocarcinoma and while some of the features suggest an endometrial origin, differential includes endocervical and endometrial origin.  There is minimal stroma present in the tumor fragments which limits evaluation of invasion.  She denied postmenopausal bleeding, postcoital bleeding, abnormal discharge, or pelvic pain.  Her gynecologic history includes an endometrial ablation in approximately 2012 and  has been amenorrheic since that time.  There is also evidence of a benign endometrial biopsy in 2011.  She reports a personal history of an abnormal pap smear in 1990 that was treated with cryotherapy.  She had an additional abnormal Pap smear some years later however this was cleared with treatment for an infection.  She denies having abnormal Pap smears since that time.  There is evidence of a cytologically benign Pap smear in 2016.  HPV testing was not performed on that specimen.  The patient's family history is significant for a maternal grandmother with a history of colon cancer, and a maternal aunt with a history of cervical cancer.  On 02/21/18 she underwent cold knife conization of the cervix. Final pathology confirmed: 1. Cervix, cone - ENDOCERVICAL ADENOCARCINOMA, MODERATELY DIFFERENTIATED. - ECTOCERVICAL AND DEEP RESECTION MARGINS ARE POSITIVE, SEE COMMENT. - TUMOR SPANS AT LEAST 1.4 CM IN LENGTH AND 0.4 CM DEEP. 2. Endocervix, curettage, post cone - ENDOCERVICAL ADENOCARCINOMA. 3. Endometrium, curettage - ENDOCERVICAL ADENOCARCINOMA. Microscopic Comment 1. The sections are somewhat fragmented due to tumor involvement. The anterior sections display abundant tumor which spans at least 1.4 cm and involves the endocervical margin. The tumor invades at least 0.4 cm and involves the deep/radial margin. The ectocervical margin appears negative. Lymphovascular invasion is not seen.  On 03/11/18 she had a PET scan resulting: IMPRESSION: 1. Intense hypermetabolic activity at the uterine cervix consistent with cervical carcinoma. 2. No evidence of local nodal metastasis or distant metastatic Disease. 3. Relative low-density lesion in the liver cannot be fully characterize however does not have metabolic activity and therefore favored benign.  On 04/04/18, she underwent a robotic-assisted  type III radical laparoscopic hysterectomy with bilateral salpingectomy and bilateral pelvic lymphadenectomy  with Dr. Everitt Amber. Her post-operative course was positive for moderate POD1 nausea with decreased PO intake and mobility.  She was discharged on POD2.  Final pathology resulted: 1. Lymph node, biopsy, left external iliac - TWO OF TWO LYMPH NODES NEGATIVE FOR MALIGNANCY (0/2). 2. Lymph nodes, regional resection, right pelvic - NINE OF NINE LYMPH NODES NEGATIVE FOR CARCINOMA (0/9). 3. Lymph nodes, regional resection, left pelvic - FIVE OF FIVE LYMPH NODES NEGATIVE FOR CARCINOMA (0/5). 4. Uterus +/- tubes/ovaries, neoplastic, cervix, bilateral tubes - CERVIX: INVASIVE ADENOCARCINOMA, MODERATELY DIFFERENTIATED. TUMOR SPANS 2.5 CM WIDTH, 1.2 CM LENGTH AND 0.5 CM IN DEPTH. RESECTION MARGINS ARE NEGATIVE. SEE ONCOLOGY TABLE. - UTERUS: -ENDOMETRIUM: PROLIFERATIVE ENDOMETRIUM. NO HYPERPLASIA OR MALIGNANCY. -MYOMETRIUM: UNREMARKABLE. NO MALIGNANCY. -SEROSA: UNREMARKABLE. NO MALIGNANCY. 5. Vaginal mucosa, new true anterior margin - BENIGN SQUAMOUS MUCOSA  She had some postop voiding issues which have subsequently resolved completely.   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 She was prescribed cyclosporin for treatment. \ In February, 2020 she had been experiencing light pink vaginal spotting intermittently.  She has developed pain in the left lower quadrant - worse with pressure applied to abdomen. This was worked up by a vaginal cuff biopsy which showed granulation tissue and a CT abd/pelvis on 10/02/18 which showed no findings to explain her abdominal pains.  Interval History:  She had a normal pap with negative high risk HPV in September, 2020.  In March, 2021 she had an episode of bright red vaginal bleeding at the time of a hard bowel movement. It resolved and the she had no further episodes. She denied bleeding with intercourse. Exam with me showed no evidence of recurrence.   Review of Systems: Constitutional: Feels well. No fever, chills,  early satiety, change in appetite.  Cardiovascular: No chest pain, shortness of breath, or edema.  Pulmonary: No cough or wheeze.  Gastrointestinal: No nausea, vomiting, or diarrhea. Positive for constipation. improved abdominal pain Genitourinary: No frequency, urgency, or dysuria. no vaginal bleeding Musculoskeletal: No upper back pain. Neurologic: No weakness, numbness, or change in gait.  Psychology: No depression, anxiety, or insomnia.  Current Meds:  Outpatient Encounter Medications as of 04/19/2020  Medication Sig  . cetirizine (ZYRTEC) 10 MG tablet Take 10 mg by mouth 2 (two) times daily.  . Cholecalciferol (VITAMIN D) 2000 units CAPS Take 2,000 Units by mouth daily.    No facility-administered encounter medications on file as of 04/19/2020.    Allergy:  Allergies  Allergen Reactions  . Other Swelling and Other (See Comments)    Mild garlic  . Rocephin [Ceftriaxone Sodium In Dextrose] Hives    Social Hx:   Social History   Socioeconomic History  . Marital status: Married    Spouse name: Not on file  . Number of children: Not on file  . Years of education: Not on file  . Highest education level: Not on file  Occupational History  . Not on file  Tobacco Use  . Smoking status: Former Smoker    Packs/day: 0.00    Years: 25.00    Pack years: 0.00    Types: Cigarettes  . Smokeless tobacco: Never Used  . Tobacco comment: quit in June 2019  Vaping Use  . Vaping Use: Never used  Substance and Sexual Activity  . Alcohol use: Yes    Comment: Ocassional  . Drug use: Never  .  Sexual activity: Yes    Birth control/protection: Post-menopausal  Other Topics Concern  . Not on file  Social History Narrative  . Not on file   Social Determinants of Health   Financial Resource Strain:   . Difficulty of Paying Living Expenses: Not on file  Food Insecurity:   . Worried About Charity fundraiser in the Last Year: Not on file  . Ran Out of Food in the Last Year: Not on  file  Transportation Needs:   . Lack of Transportation (Medical): Not on file  . Lack of Transportation (Non-Medical): Not on file  Physical Activity:   . Days of Exercise per Week: Not on file  . Minutes of Exercise per Session: Not on file  Stress:   . Feeling of Stress : Not on file  Social Connections:   . Frequency of Communication with Friends and Family: Not on file  . Frequency of Social Gatherings with Friends and Family: Not on file  . Attends Religious Services: Not on file  . Active Member of Clubs or Organizations: Not on file  . Attends Archivist Meetings: Not on file  . Marital Status: Not on file  Intimate Partner Violence:   . Fear of Current or Ex-Partner: Not on file  . Emotionally Abused: Not on file  . Physically Abused: Not on file  . Sexually Abused: Not on file    Past Surgical Hx:  Past Surgical History:  Procedure Laterality Date  . CERVICAL CONIZATION W/BX N/A 02/21/2018   Procedure: COLD KNIFE CONIZATION CERVIX;  Surgeon: Everitt Amber, MD;  Location: [facility];  Service: Gynecology;  Laterality: N/A;  . [place];  07-14-1999   dr Radene Knee [facility]   Bilateral Tubal Ligation w/ last one c/s  . DILATION AND CURETTAGE OF UTERUS N/A 02/21/2018   Procedure: DILATATION AND CURETTAGE OF THE UTERUS;  Surgeon: Everitt Amber, MD;  Location: [facility];  Service: Gynecology;  Laterality: N/A;  . ENDOMETRIAL ABLATION  2002  approx.    dr Radene Knee  . HEMORRHOID SURGERY  02-20-2008    dr Brantley Stage  [facility]  . PELVIC LYMPH NODE DISSECTION N/A 04/04/2018   Procedure: BILATERAL PELVIC LYMPHADENECTOMY;  Surgeon: Everitt Amber, MD;  Location: [facility];  Service: Gynecology;  Laterality: N/A;  . ROBOTIC ASSISTED LAPAROSCOPIC HYSTERECTOMY AND SALPINGECTOMY Bilateral 04/04/2018   Procedure: XI ROBOTIC ASSISTED RADICAL HYSTERECTOMY AND SALPINGECTOMY;  Surgeon: Everitt Amber, MD;  Location: [facility];  Service: Gynecology;  Laterality: Bilateral;  .  TUBAL LIGATION    . VAGINAL DELIVERY     1989    Past Medical Hx:  Past Medical History:  Diagnosis Date  . Cervical cancer ([place])   . Hemorrhoids   . Hives   . PONV (postoperative nausea and vomiting)   . Postmenopausal   . Seasonal allergies   . Vitamin D deficiency     Family Hx:  Family History  Problem Relation Age of Onset  . Hypertension Mother   . Lupus Sister   . Cervical cancer Maternal Aunt   . Colon cancer Maternal Grandfather     Vitals:  Blood pressure (!) 104/56, pulse (!) 52, temperature 98.1 F (36.7 C), temperature source Tympanic, resp. rate 16, height 5\' 4"  (1.626 m), weight 200 lb 3.2 oz (90.8 kg), SpO2 100 %.  Physical Exam:  General: Well developed, well nourished female in no acute distress. Alert and oriented x 3.  Cardiovascular: Regular rate and rhythm.  S1 and S2 normal.  Lungs: Clear to auscultation bilaterally. No wheezes/crackles/rhonchi noted.  Skin: No rashes or lesions present. Back: Mild CVA tenderness.  Abdomen: Abdomen soft and non obese. Active bowel sounds in all quadrants. No evidence of a fluid wave or abdominal masses.  Soft abdomen and incisions. No tenderness to palpate LLQ, no masses Extremities: No bilateral cyanosis, edema, or clubbing.  Pelvic: vaginal cuff smooth and regular, no granulation tissue.  No masses or lesions. Rectal: no masses or lesions.  [name], MD 04/19/2020, 1:15 PM

## 2020-04-19 NOTE — Patient Instructions (Signed)
Please notify Dr Denman George at phone number [phone] if you notice vaginal bleeding, new pelvic or abdominal pains, bloating, feeling full easy, or a change in bladder or bowel function.   Please contact Dr Serita Grit office (at [phone]) in December or January to request an appointment with her for March, 2022.

## 2020-04-22 LAB — CYTOLOGY - PAP
Comment: NEGATIVE
Diagnosis: NEGATIVE
High risk HPV: NEGATIVE

## 2020-04-26 ENCOUNTER — Telehealth: Payer: Self-pay

## 2020-04-26 NOTE — Telephone Encounter (Signed)
TC to patient.  Normal pap w/ HPV negative results reported to patient.  Patient appreciative of call.

## 2020-08-30 ENCOUNTER — Telehealth: Payer: Self-pay | Admitting: *Deleted

## 2020-08-30 NOTE — Telephone Encounter (Signed)
Returned the patient's call and scheduled a follow up appt for 3/8 at 2 pm with Dr Denman George

## 2020-10-04 ENCOUNTER — Encounter: Payer: Self-pay | Admitting: Gynecologic Oncology

## 2020-10-05 ENCOUNTER — Other Ambulatory Visit: Payer: Self-pay

## 2020-10-05 ENCOUNTER — Encounter: Payer: Self-pay | Admitting: Gynecologic Oncology

## 2020-10-05 ENCOUNTER — Inpatient Hospital Stay: Payer: 59 | Attending: Gynecologic Oncology | Admitting: Gynecologic Oncology

## 2020-10-05 VITALS — BP 121/55 | HR 50 | Temp 97.8°F | Resp 17 | Ht 64.0 in | Wt 199.5 lb

## 2020-10-05 DIAGNOSIS — Z90710 Acquired absence of both cervix and uterus: Secondary | ICD-10-CM | POA: Insufficient documentation

## 2020-10-05 DIAGNOSIS — Z87891 Personal history of nicotine dependence: Secondary | ICD-10-CM | POA: Insufficient documentation

## 2020-10-05 DIAGNOSIS — Z90722 Acquired absence of ovaries, bilateral: Secondary | ICD-10-CM | POA: Insufficient documentation

## 2020-10-05 DIAGNOSIS — C539 Malignant neoplasm of cervix uteri, unspecified: Secondary | ICD-10-CM

## 2020-10-05 DIAGNOSIS — Z8541 Personal history of malignant neoplasm of cervix uteri: Secondary | ICD-10-CM | POA: Diagnosis not present

## 2020-10-05 NOTE — Patient Instructions (Signed)
Please notify Dr Denman George at phone number [phone] if you notice vaginal bleeding, new pelvic or abdominal pains, bloating, feeling full easy, or a change in bladder or bowel function.   Please contact Dr Serita Grit office (at [phone]) in June to request an appointment with her for September, 2022.

## 2020-10-05 NOTE — Progress Notes (Signed)
Follow Up Note: Gyn-Onc  [name] 55 y.o. female  CC:  Chief Complaint  Patient presents with  . Malignant neoplasm of cervix, unspecified site [facility])   Assessment/Plan: 55 year old female s/p robotic-assisted type III radical laparoscopic hysterectomy with bilateral salpingectomy and bilateral pelvic lymphadenectomy with Dr. 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   Apparent low risk disease on final pathology. Adjuvant therapy not indicated according to NCCN guidelines.    Will repeat pap at her September visits.  I will see her again in 6 months for surveillance.  HPI: Regina Salinas is a 55 year old female initially seen in consultation at the request of Dr. Radene Knee for adenocarcinoma of the endocervix and cervix. On January 23, 2018, she was seen by Dr. Radene Knee for her routine gynecologic examination.  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  For evaluation for the pap findings, she underwent a colposcopic examination of the cervix on February 07, 2018.  She did have acetowhite changes with slight punctation located on the left side of the cervix near the opening.  This was biopsied along with endocervical curettings taken.  The pathology from this colposcopy with biopsies revealed in the cervical biopsy at 3:00 adenocarcinoma, and in the endocervical curettage adenocarcinoma.  The comment and pathology was that both of the specimens show fragments of adenocarcinoma and while some of the features suggest an endometrial origin, differential includes endocervical and endometrial origin.  There is minimal stroma present in the tumor fragments which limits evaluation of invasion.  She denied postmenopausal bleeding, postcoital bleeding, abnormal discharge, or pelvic pain.  Her gynecologic history includes an endometrial ablation in  approximately 2012 and has been amenorrheic since that time.  There is also evidence of a benign endometrial biopsy in 2011.  She reports a personal history of an abnormal pap smear in 1990 that was treated with cryotherapy.  She had an additional abnormal Pap smear some years later however this was cleared with treatment for an infection.  She denies having abnormal Pap smears since that time.  There is evidence of a cytologically benign Pap smear in 2016.  HPV testing was not performed on that specimen.  The patient's family history is significant for a maternal grandmother with a history of colon cancer, and a maternal aunt with a history of cervical cancer.  On 02/21/18 she underwent cold knife conization of the cervix. Final pathology confirmed: 1. Cervix, cone - ENDOCERVICAL ADENOCARCINOMA, MODERATELY DIFFERENTIATED. - ECTOCERVICAL AND DEEP RESECTION MARGINS ARE POSITIVE, SEE COMMENT. - TUMOR SPANS AT LEAST 1.4 CM IN LENGTH AND 0.4 CM DEEP. 2. Endocervix, curettage, post cone - ENDOCERVICAL ADENOCARCINOMA. 3. Endometrium, curettage - ENDOCERVICAL ADENOCARCINOMA. Microscopic Comment 1. The sections are somewhat fragmented due to tumor involvement. The anterior sections display abundant tumor which spans at least 1.4 cm and involves the endocervical margin. The tumor invades at least 0.4 cm and involves the deep/radial margin. The ectocervical margin appears negative. Lymphovascular invasion is not seen.  On 03/11/18 she had a PET scan resulting: IMPRESSION: 1. Intense hypermetabolic activity at the uterine cervix consistent with cervical carcinoma. 2. No evidence of local nodal metastasis or distant metastatic Disease. 3. Relative low-density lesion in the liver cannot be fully characterize however does not have metabolic activity and therefore favored benign.  On 04/04/18, she underwent a robotic-assisted  type III radical laparoscopic hysterectomy with bilateral salpingectomy and bilateral  pelvic lymphadenectomy with Dr. Everitt Amber. Her post-operative course was positive for moderate POD1 nausea with decreased PO intake and mobility.  She was discharged on POD2.  Final pathology resulted: 1. Lymph node, biopsy, left external iliac - TWO OF TWO LYMPH NODES NEGATIVE FOR MALIGNANCY (0/2). 2. Lymph nodes, regional resection, right pelvic - NINE OF NINE LYMPH NODES NEGATIVE FOR CARCINOMA (0/9). 3. Lymph nodes, regional resection, left pelvic - FIVE OF FIVE LYMPH NODES NEGATIVE FOR CARCINOMA (0/5). 4. Uterus +/- tubes/ovaries, neoplastic, cervix, bilateral tubes - CERVIX: INVASIVE ADENOCARCINOMA, MODERATELY DIFFERENTIATED. TUMOR SPANS 2.5 CM WIDTH, 1.2 CM LENGTH AND 0.5 CM IN DEPTH. RESECTION MARGINS ARE NEGATIVE. SEE ONCOLOGY TABLE. - UTERUS: -ENDOMETRIUM: PROLIFERATIVE ENDOMETRIUM. NO HYPERPLASIA OR MALIGNANCY. -MYOMETRIUM: UNREMARKABLE. NO MALIGNANCY. -SEROSA: UNREMARKABLE. NO MALIGNANCY. 5. Vaginal mucosa, new true anterior margin - BENIGN SQUAMOUS MUCOSA  She had some postop voiding issues which have subsequently resolved completely.   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 She was prescribed cyclosporin for treatment. \ In February, 2020 she had been experiencing light pink vaginal spotting intermittently.  She has developed pain in the left lower quadrant - worse with pressure applied to abdomen. This was worked up by a vaginal cuff biopsy which showed granulation tissue and a CT abd/pelvis on 10/02/18 which showed no findings to explain her abdominal pains.  Interval History:  She had a normal pap with negative high risk HPV in September, 2020.  She has no symptoms concerning for recurrence.  Review of Systems: Constitutional: Feels well. No fever, chills, early satiety, change in appetite.  Cardiovascular: No chest pain, shortness of breath, or edema.  Pulmonary: No cough or wheeze.  Gastrointestinal: No nausea,  vomiting, or diarrhea. Positive for constipation. improved abdominal pain Genitourinary: No frequency, urgency, or dysuria. no vaginal bleeding Musculoskeletal: No upper back pain. Neurologic: No weakness, numbness, or change in gait.  Psychology: No depression, anxiety, or insomnia.  Current Meds:  Outpatient Encounter Medications as of 10/05/2020  Medication Sig  . Cholecalciferol (VITAMIN D) 2000 units CAPS Take 2,000 Units by mouth daily.   . [DISCONTINUED] cetirizine (ZYRTEC) 10 MG tablet Take 10 mg by mouth 2 (two) times daily.   No facility-administered encounter medications on file as of 10/05/2020.    Allergy:  Allergies  Allergen Reactions  . Other Swelling and Other (See Comments)    Mild garlic  . Rocephin [Ceftriaxone Sodium In Dextrose] Hives    Social Hx:   Social History   Socioeconomic History  . Marital status: Married    Spouse name: Not on file  . Number of children: Not on file  . Years of education: Not on file  . Highest education level: Not on file  Occupational History  . Not on file  Tobacco Use  . Smoking status: Former Smoker    Packs/day: 0.00    Years: 25.00    Pack years: 0.00    Types: Cigarettes  . Smokeless tobacco: Never Used  . Tobacco comment: quit in June 2019  Vaping Use  . Vaping Use: Never used  Substance and Sexual Activity  . Alcohol use: Yes    Comment: Ocassional  . Drug use: Never  . Sexual activity: Yes    Birth control/protection: Post-menopausal  Other Topics Concern  . Not on file  Social History Narrative  . Not on file   Social Determinants of Health  Financial Resource Strain: Not on file  Food Insecurity: Not on file  Transportation Needs: Not on file  Physical Activity: Not on file  Stress: Not on file  Social Connections: Not on file  Intimate Partner Violence: Not on file    Past Surgical Hx:  Past Surgical History:  Procedure Laterality Date  . CERVICAL CONIZATION W/BX N/A 02/21/2018    Procedure: COLD KNIFE CONIZATION CERVIX;  Surgeon: Everitt Amber, MD;  Location: [facility];  Service: Gynecology;  Laterality: N/A;  . [place];  07-14-1999   dr Radene Knee [facility]   Bilateral Tubal Ligation w/ last one c/s  . DILATION AND CURETTAGE OF UTERUS N/A 02/21/2018   Procedure: DILATATION AND CURETTAGE OF THE UTERUS;  Surgeon: Everitt Amber, MD;  Location: [facility];  Service: Gynecology;  Laterality: N/A;  . ENDOMETRIAL ABLATION  2002  approx.    dr Radene Knee  . HEMORRHOID SURGERY  02-20-2008    dr Brantley Stage  [facility]  . PELVIC LYMPH NODE DISSECTION N/A 04/04/2018   Procedure: BILATERAL PELVIC LYMPHADENECTOMY;  Surgeon: Everitt Amber, MD;  Location: [facility];  Service: Gynecology;  Laterality: N/A;  . ROBOTIC ASSISTED LAPAROSCOPIC HYSTERECTOMY AND SALPINGECTOMY Bilateral 04/04/2018   Procedure: XI ROBOTIC ASSISTED RADICAL HYSTERECTOMY AND SALPINGECTOMY;  Surgeon: Everitt Amber, MD;  Location: [facility];  Service: Gynecology;  Laterality: Bilateral;  . TUBAL LIGATION    . VAGINAL DELIVERY     1989    Past Medical Hx:  Past Medical History:  Diagnosis Date  . Cervical cancer ([place])   . Hemorrhoids   . Hives   . PONV (postoperative nausea and vomiting)   . Postmenopausal   . Seasonal allergies   . Vitamin D deficiency     Family Hx:  Family History  Problem Relation Age of Onset  . Hypertension Mother   . Lupus Sister   . Cervical cancer Maternal Aunt   . Colon cancer Maternal Grandfather     Vitals:  Blood pressure (!) 121/55, pulse (!) 50, temperature 97.8 F (36.6 C), temperature source Tympanic, resp. rate 17, height 5\' 4"  (1.626 m), weight 199 lb 8.3 oz (90.5 kg), SpO2 100 %.  Physical Exam:  General: Well developed, well nourished female in no acute distress. Alert and oriented x 3.  Cardiovascular: Regular rate and rhythm. S1 and S2 normal.  Lungs: Clear to auscultation bilaterally. No wheezes/crackles/rhonchi noted.  Skin: No rashes or lesions  present. Back: Mild CVA tenderness.  Abdomen: Abdomen soft and non obese. Active bowel sounds in all quadrants. No evidence of a fluid wave or abdominal masses.  Soft abdomen and incisions. No tenderness to palpate LLQ, no masses Extremities: No bilateral cyanosis, edema, or clubbing.  Pelvic: vaginal cuff smooth and regular, no granulation tissue.  No masses or lesions. Rectal: no masses or lesions.  [name], MD 10/05/2020, 2:17 PM

## 2021-03-30 ENCOUNTER — Telehealth: Payer: Self-pay | Admitting: *Deleted

## 2021-03-30 NOTE — Telephone Encounter (Signed)
Returned the patient's call and scheduled a follow up appt

## 2021-04-19 ENCOUNTER — Encounter: Payer: Self-pay | Admitting: Gynecologic Oncology

## 2021-04-19 NOTE — Progress Notes (Signed)
Follow Up Note: Gyn-Onc  Regina Salinas 55 y.o. female  CC:  Chief Complaint  Patient presents with   Malignant neoplasm of cervix, unspecified site [facility])    Assessment/Plan: 55 year old female s/p robotic-assisted type III radical laparoscopic hysterectomy with bilateral salpingectomy and bilateral pelvic lymphadenectomy with Dr. 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   Apparent low risk disease on final pathology. Adjuvant therapy was not indicated according to NCCN guidelines.    Will repeat pap at her September visits.  We will see her again in 6 months for surveillance. She will follow-up with my partner, Dr Berline Lopes as I am leaving the practice this month. If she remains disease free in September of 2024 she will be able to suspend treatment.     HPI: Regina Salinas is a 55 year old female initially seen in consultation at the request of Dr. Radene Knee for adenocarcinoma of the endocervix and cervix. On January 23, 2018, she was seen by Dr. Radene Knee for her routine gynecologic examination.  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   For evaluation for the pap findings, she underwent a colposcopic examination of the cervix on February 07, 2018.  She did have acetowhite changes with slight punctation located on the left side of the cervix near the opening.  This was biopsied along with endocervical curettings taken.  The pathology from this colposcopy with biopsies revealed in the cervical biopsy at 3:00 adenocarcinoma, and in the endocervical curettage adenocarcinoma.  The comment and pathology was that both of the specimens show fragments of adenocarcinoma and while some of the features suggest an endometrial origin, differential includes endocervical and endometrial origin.  There is minimal stroma present in the tumor fragments which  limits evaluation of invasion.   She denied postmenopausal bleeding, postcoital bleeding, abnormal discharge, or pelvic pain.  Her gynecologic history includes an endometrial ablation in approximately 2012 and has been amenorrheic since that time.  There is also evidence of a benign endometrial biopsy in 2011.  She reports a personal history of an abnormal pap smear in 1990 that was treated with cryotherapy.  She had an additional abnormal Pap smear some years later however this was cleared with treatment for an infection.  She denies having abnormal Pap smears since that time.  There is evidence of a cytologically benign Pap smear in 2016.  HPV testing was not performed on that specimen.   The patient's family history is significant for a maternal grandmother with a history of colon cancer, and a maternal aunt with a history of cervical cancer.   On 02/21/18 she underwent cold knife conization of the cervix. Final pathology confirmed endocervical adenocarcinoma, moderately differentiated with positive endocervical margins. The tumor was at least 1.4cm wide and 0.4cm deep. The post cone ECC and endometrial curettage both showed endocervical adenocarcinoma. LVSI was not seen.   On 03/11/18 she had a PET scan resulting no evidence of local nodal metastasis or distant metastatic disease.   On 04/04/18, she underwent a robotic-assisted type III radical laparoscopic hysterectomy with bilateral salpingectomy and bilateral pelvic lymphadenectomy with Dr. Everitt Amber. Her final pathology showed an invasive cervical adenocarcinoma, moderately differentiated, spanning 2.5cm with 1.2cm length and 0.5cm depth. The cervical and vaginal margins and parametrium were negative and lymphadenectomy was negative for metastases. She was felt to have low  risk factors for recurrence based on Sedlis and Ferdinand Lango criteria and was recommended close observation rather than adjuvant therapy.   She had some postop voiding issues which have  subsequently resolved completely.   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 She was prescribed cyclosporin for treatment.  In February, 2020 she had been experiencing light pink vaginal spotting intermittently and pain in the left lower quadrant - worse with pressure applied to abdomen. This was worked up by a vaginal cuff biopsy which showed granulation tissue and a CT abd/pelvis on 10/02/18 which showed no findings to explain her abdominal pains.  Interval History:  She had a normal pap with negative high risk HPV in September, 2020.  She has no symptoms concerning for recurrence.  Review of Systems: Constitutional: Feels well. No fever, chills, early satiety, change in appetite.  Cardiovascular: No chest pain, shortness of breath, or edema.  Pulmonary: No cough or wheeze.  Gastrointestinal: No nausea, vomiting, or diarrhea. Positive for constipation. improved abdominal pain Genitourinary: No frequency, urgency, or dysuria. no vaginal bleeding Musculoskeletal: No upper back pain. Neurologic: No weakness, numbness, or change in gait.  Psychology: No depression, anxiety, or insomnia.  Current Meds:  Outpatient Encounter Medications as of 04/20/2021  Medication Sig   Cholecalciferol (VITAMIN D) 2000 units CAPS Take 2,000 Units by mouth daily.    No facility-administered encounter medications on file as of 04/20/2021.    Allergy:  Allergies  Allergen Reactions   Other Swelling and Other (See Comments)    Mild garlic   Rocephin [Ceftriaxone Sodium In Dextrose] Hives    Social Hx:   Social History   Socioeconomic History   Marital status: Married    Spouse name: Not on file   Number of children: Not on file   Years of education: Not on file   Highest education level: Not on file  Occupational History   Not on file  Tobacco Use   Smoking status: Former    Packs/day: 0.00    Years: 25.00    Pack years: 0.00    Types: Cigarettes    Smokeless tobacco: Never   Tobacco comments:    quit in June 2019  Vaping Use   Vaping Use: Never used  Substance and Sexual Activity   Alcohol use: Yes    Comment: Ocassional   Drug use: Never   Sexual activity: Yes    Birth control/protection: Post-menopausal  Other Topics Concern   Not on file  Social History Narrative   Not on file   Social Determinants of Health   Financial Resource Strain: Not on file  Food Insecurity: Not on file  Transportation Needs: Not on file  Physical Activity: Not on file  Stress: Not on file  Social Connections: Not on file  Intimate Partner Violence: Not on file    Past Surgical Hx:  Past Surgical History:  Procedure Laterality Date   CERVICAL CONIZATION W/BX N/A 02/21/2018   Procedure: COLD KNIFE CONIZATION CERVIX;  Surgeon: Everitt Amber, MD;  Location: [facility];  Service: Gynecology;  Laterality: N/A;   [place];  07-14-1999   dr Radene Knee [facility]   Bilateral Tubal Ligation w/ last one c/s   DILATION AND CURETTAGE OF UTERUS N/A 02/21/2018   Procedure: DILATATION AND CURETTAGE OF THE UTERUS;  Surgeon: Everitt Amber, MD;  Location: [place];  Service: Gynecology;  Laterality: N/A;   ENDOMETRIAL ABLATION  2002  approx.  dr Radene Knee   HEMORRHOID SURGERY  02-20-2008    dr Brantley Stage  [facility]   PELVIC LYMPH NODE DISSECTION N/A 04/04/2018   Procedure: BILATERAL PELVIC LYMPHADENECTOMY;  Surgeon: Everitt Amber, MD;  Location: [facility];  Service: Gynecology;  Laterality: N/A;   ROBOTIC ASSISTED LAPAROSCOPIC HYSTERECTOMY AND SALPINGECTOMY Bilateral 04/04/2018   Procedure: XI ROBOTIC ASSISTED RADICAL HYSTERECTOMY AND SALPINGECTOMY;  Surgeon: Everitt Amber, MD;  Location: [facility];  Service: Gynecology;  Laterality: Bilateral;   TUBAL LIGATION     VAGINAL DELIVERY     1989    Past Medical Hx:  Past Medical History:  Diagnosis Date   Cervical cancer ([place])    Hemorrhoids    Hives    PONV (postoperative nausea and vomiting)     Postmenopausal    Seasonal allergies    Vitamin D deficiency     Family Hx:  Family History  Problem Relation Age of Onset   Hypertension Mother    Lupus Sister    Cervical cancer Maternal Aunt    Colon cancer Maternal Grandfather     Vitals:  Blood pressure 139/62, pulse 64, temperature 97.9 F (36.6 C), temperature source Oral, resp. rate 16, height 5\' 4"  (1.626 m), weight 196 lb (88.9 kg), SpO2 100 %.  Physical Exam:  General: Well developed, well nourished female in no acute distress. Alert and oriented x 3.  Cardiovascular: well perfused peripheries.  Pulm: no increased WOB Skin: No rashes or lesions present. Back: Mild CVA tenderness.  Abdomen: Abdomen soft and non obese. Active bowel sounds in all quadrants. No evidence of a fluid wave or abdominal masses.  Soft abdomen and incisions. No tenderness to palpate LLQ, no masses Extremities: No bilateral cyanosis, edema, or clubbing.  Pelvic: vaginal cuff smooth and regular, no granulation tissue.  No masses or lesions. Rectal: no masses or lesions.  [name], MD 04/20/2021, 9:55 AM

## 2021-04-20 ENCOUNTER — Inpatient Hospital Stay: Payer: 59 | Attending: Gynecologic Oncology | Admitting: Gynecologic Oncology

## 2021-04-20 ENCOUNTER — Other Ambulatory Visit: Payer: Self-pay

## 2021-04-20 ENCOUNTER — Encounter: Payer: Self-pay | Admitting: Gynecologic Oncology

## 2021-04-20 ENCOUNTER — Other Ambulatory Visit (HOSPITAL_COMMUNITY)
Admission: RE | Admit: 2021-04-20 | Discharge: 2021-04-20 | Disposition: A | Discharge status: Home / Self Care | Payer: 59 | Source: Ambulatory Visit | Attending: Gynecologic Oncology | Admitting: Gynecologic Oncology

## 2021-04-20 VITALS — BP 139/62 | HR 64 | Temp 97.9°F | Resp 16 | Ht 64.0 in | Wt 196.0 lb

## 2021-04-20 DIAGNOSIS — Z8541 Personal history of malignant neoplasm of cervix uteri: Secondary | ICD-10-CM | POA: Diagnosis not present

## 2021-04-20 DIAGNOSIS — C539 Malignant neoplasm of cervix uteri, unspecified: Secondary | ICD-10-CM

## 2021-04-20 DIAGNOSIS — Z9079 Acquired absence of other genital organ(s): Secondary | ICD-10-CM | POA: Insufficient documentation

## 2021-04-20 DIAGNOSIS — Z90710 Acquired absence of both cervix and uterus: Secondary | ICD-10-CM | POA: Insufficient documentation

## 2021-04-20 NOTE — Patient Instructions (Signed)
Please notify Dr Denman George at phone number [phone] if you notice vaginal bleeding, new pelvic or abdominal pains, bloating, feeling full easy, or a change in bladder or bowel function.   Dr Denman George is departing the [place] [facility] in October, 2022. Her partners and colleagues including Dr Berline Lopes, Dr Delsa Sale and [name], Nurse Practitioner will be available to continue your care.   You are next scheduled to return to the Gynecologic Oncology office at the [facility] in March, 2023. Please call [phone] in January to request an appointment for March with Dr Serita Grit partner, Dr Berline Lopes.  Our office will contact you with your pap smear results.

## 2021-04-25 LAB — CYTOLOGY - PAP
Comment: NEGATIVE
Diagnosis: NEGATIVE
High risk HPV: NEGATIVE

## 2021-08-08 ENCOUNTER — Telehealth: Payer: Self-pay

## 2021-08-08 NOTE — Telephone Encounter (Signed)
Patient called back and is scheduled for March with Dr Berline Lopes

## 2021-08-08 NOTE — Telephone Encounter (Signed)
Returning call to patient to setup a follow up appointment with Dr. Berline Lopes. Unable to contact patient. Left message requesting return call.

## 2021-10-12 ENCOUNTER — Encounter: Payer: Self-pay | Admitting: Gynecologic Oncology

## 2021-10-17 ENCOUNTER — Encounter: Payer: Self-pay | Admitting: Gynecologic Oncology

## 2021-10-17 ENCOUNTER — Encounter: Payer: Self-pay | Admitting: *Deleted

## 2021-10-17 ENCOUNTER — Other Ambulatory Visit: Payer: Self-pay

## 2021-10-17 ENCOUNTER — Inpatient Hospital Stay: Payer: Commercial Managed Care - PPO | Attending: Gynecologic Oncology | Admitting: Gynecologic Oncology

## 2021-10-17 VITALS — BP 144/66 | HR 55 | Temp 98.3°F | Resp 16 | Ht 63.78 in | Wt 196.0 lb

## 2021-10-17 DIAGNOSIS — C539 Malignant neoplasm of cervix uteri, unspecified: Secondary | ICD-10-CM

## 2021-10-17 DIAGNOSIS — Z90722 Acquired absence of ovaries, bilateral: Secondary | ICD-10-CM | POA: Diagnosis not present

## 2021-10-17 DIAGNOSIS — Z90710 Acquired absence of both cervix and uterus: Secondary | ICD-10-CM | POA: Insufficient documentation

## 2021-10-17 DIAGNOSIS — Z8541 Personal history of malignant neoplasm of cervix uteri: Secondary | ICD-10-CM | POA: Diagnosis present

## 2021-10-17 NOTE — Progress Notes (Signed)
Gynecologic Oncology Return Clinic Visit ? ?10/17/2021 ? ?Reason for Visit: Surveillance visit in the setting of a history of early stage cervical cancer ? ?Treatment History: ?On January 23, 2018, she was seen by Dr. Radene Knee for her routine gynecologic examination.  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 ?  ?For evaluation for the pap findings, she underwent a colposcopic examination of the cervix on February 07, 2018.  She did have acetowhite changes with slight punctation located on the left side of the cervix near the opening.  This was biopsied along with endocervical curettings taken.  The pathology from this colposcopy with biopsies revealed in the cervical biopsy at 3:00 adenocarcinoma, and in the endocervical curettage adenocarcinoma.  The comment and pathology was that both of the specimens show fragments of adenocarcinoma and while some of the features suggest an endometrial origin, differential includes endocervical and endometrial origin.  There is minimal stroma present in the tumor fragments which limits evaluation of invasion. ?  ?She denied postmenopausal bleeding, postcoital bleeding, abnormal discharge, or pelvic pain.  Her gynecologic history includes an endometrial ablation in approximately 2012 and has been amenorrheic since that time.  There is also evidence of a benign endometrial biopsy in 2011.  She reports a personal history of an abnormal pap smear in 1990 that was treated with cryotherapy.  She had an additional abnormal Pap smear some years later however this was cleared with treatment for an infection.  She denies having abnormal Pap smears since that time.  There is evidence of a cytologically benign Pap smear in 2016.  HPV testing was not performed on that specimen. ?  ?The patient's family history is significant for a maternal grandmother with a history of  colon cancer, and a maternal aunt with a history of cervical cancer. ?  ?On 02/21/18 she underwent cold knife conization of the cervix. Final pathology confirmed endocervical adenocarcinoma, moderately differentiated with positive endocervical margins. The tumor was at least 1.4cm wide and 0.4cm deep. The post cone ECC and endometrial curettage both showed endocervical adenocarcinoma. LVSI was not seen.  ?  ?On 03/11/18 she had a PET scan resulting no evidence of local nodal metastasis or distant metastatic disease.  ?  ?On 04/04/18, she underwent a robotic-assisted type III radical laparoscopic hysterectomy with bilateral salpingectomy and bilateral pelvic lymphadenectomy with Dr. Everitt Amber. Her final pathology showed an invasive cervical adenocarcinoma, moderately differentiated, spanning 2.5cm with 1.2cm length and 0.5cm depth. The cervical and vaginal margins and parametrium were negative and lymphadenectomy was negative for metastases. She was felt to have low risk factors for recurrence based on Sedlis and Ferdinand Lango criteria and was recommended close observation rather than adjuvant therapy.  ?  ?She had some postop voiding issues which have subsequently resolved completely. ?  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 She was prescribed cyclosporin for treatment.  ?In February, 2020 she had been experiencing light pink vaginal spotting intermittently and pain in the left lower quadrant - worse with pressure applied to abdomen. This was worked up by a vaginal cuff biopsy which showed granulation tissue and a CT abd/pelvis on 10/02/18 which showed no findings to explain her abdominal pains. ?  ?She had a normal pap with negative high risk HPV in September, 2020. Similarly, in 03/2020 and 03/2021, paps were negative, HR HPV negative. ? ?  Interval History: ?Patient reports doing well since her last visit.  She denies any vaginal bleeding or discharge.  Reports regular bowel  function.  Still struggles with some urinary dysfunction that began after her surgery.  Does not always feel when she needs to void.  Endorses a good appetite. ? ?Past Medical/Surgical History: ?Past Medical History:  ?Diagnosis Date  ? Cervical cancer ([place])   ? Hemorrhoids   ? Hives   ? PONV (postoperative nausea and vomiting)   ? Postmenopausal   ? Seasonal allergies   ? Vitamin D deficiency   ? ? ?Past Surgical History:  ?Procedure Laterality Date  ? CERVICAL CONIZATION W/BX N/A 02/21/2018  ? Procedure: COLD KNIFE CONIZATION CERVIX;  Surgeon: Everitt Amber, MD;  Location: [facility];  Service: Gynecology;  Laterality: N/A;  ? [place];  07-14-1999   dr Radene Knee [facility]  ? Bilateral Tubal Ligation w/ last one c/s  ? DILATION AND CURETTAGE OF UTERUS N/A 02/21/2018  ? Procedure: DILATATION AND CURETTAGE OF THE UTERUS;  Surgeon: Everitt Amber, MD;  Location: [facility];  Service: Gynecology;  Laterality: N/A;  ? ENDOMETRIAL ABLATION  2002  approx.    dr Radene Knee  ? HEMORRHOID SURGERY  02-20-2008    dr Brantley Stage  [facility]  ? PELVIC LYMPH NODE DISSECTION N/A 04/04/2018  ? Procedure: BILATERAL PELVIC LYMPHADENECTOMY;  Surgeon: Everitt Amber, MD;  Location: [facility];  Service: Gynecology;  Laterality: N/A;  ? ROBOTIC ASSISTED LAPAROSCOPIC HYSTERECTOMY AND SALPINGECTOMY Bilateral 04/04/2018  ? Procedure: XI ROBOTIC ASSISTED RADICAL HYSTERECTOMY AND SALPINGECTOMY;  Surgeon: Everitt Amber, MD;  Location: [facility];  Service: Gynecology;  Laterality: Bilateral;  ? TUBAL LIGATION    ? VAGINAL DELIVERY    ? 1989  ? ? ?Family History  ?Problem Relation Age of Onset  ? Hypertension Mother   ? Lupus Sister   ? Cervical cancer Maternal Aunt   ? Colon cancer Maternal Grandfather   ? ? ?Social History  ? ?Socioeconomic History  ? Marital status: Married  ?  Spouse name: Not on file  ? Number of children: Not on file  ? Years of education: Not on file  ? Highest education level: Not on file  ?Occupational History  ? Not  on file  ?Tobacco Use  ? Smoking status: Former  ?  Packs/day: 0.00  ?  Years: 25.00  ?  Pack years: 0.00  ?  Types: Cigarettes  ? Smokeless tobacco: Never  ? Tobacco comments:  ?  quit in June 2019  ?Vaping Use  ? Vaping Use: Never used  ?Substance and Sexual Activity  ? Alcohol use: Yes  ?  Comment: Ocassional  ? Drug use: Never  ? Sexual activity: Yes  ?  Birth control/protection: Post-menopausal  ?Other Topics Concern  ? Not on file  ?Social History Narrative  ? Not on file  ? ?Social Determinants of Health  ? ?Financial Resource Strain: Not on file  ?Food Insecurity: Not on file  ?Transportation Needs: Not on file  ?Physical Activity: Not on file  ?Stress: Not on file  ?Social Connections: Not on file  ? ? ?Current Medications: ? ?Current Outpatient Medications:  ?  Cetirizine HCl (ZYRTEC ALLERGY) 10 MG CAPS, , Disp: , Rfl:  ?  Cholecalciferol (VITAMIN D) 2000 units CAPS, Take 2,000 Units by mouth daily. , Disp: , Rfl:  ? ?Review of Systems: ?Denies appetite changes, fevers, chills, fatigue, unexplained weight changes. ?Denies hearing loss, neck lumps or masses,  mouth sores, ringing in ears or voice changes. ?Denies cough or wheezing.  Denies shortness of breath. ?Denies chest pain or palpitations. Denies leg swelling. ?Denies abdominal distention, pain, blood in stools, constipation, diarrhea, nausea, vomiting, or early satiety. ?Denies pain with intercourse, dysuria, frequency, hematuria or incontinence. ?Denies hot flashes, pelvic pain, vaginal bleeding or vaginal discharge.   ?Denies joint pain, back pain or muscle pain/cramps. ?Denies itching, rash, or wounds. ?Denies dizziness, headaches, numbness or seizures. ?Denies swollen lymph nodes or glands, denies easy bruising or bleeding. ?Denies anxiety, depression, confusion, or decreased concentration. ? ?Physical Exam: ?BP (!) 144/66 (BP Location: Left Arm, Patient Position: Sitting)   Pulse (!) 55   Temp 98.3 ?F (36.8 ?C) (Oral)   Resp 16   Ht 5' 3.78"  (1.62 m)   Wt 196 lb (88.9 kg)   SpO2 100%   BMI 33.88 kg/m?  ?General: Alert, oriented, no acute distress. ?HEENT: Normocephalic, atraumatic, sclera anicteric. ?Chest: Clear to auscultation bilaterally.  No whee

## 2021-10-17 NOTE — Patient Instructions (Signed)
It was a pleasure meeting you today.  I do not see or feel any evidence of cancer recurrence on your exam. ? ?We will continue with visits every 6 months.  You will be due for a Pap test at your visit in September.  Because my schedule is not out past the end of June, please call us in July or August to get a visit scheduled with me in mid to late September. ? ?If you develop any concerning symptoms before that visit, such as vaginal bleeding, unintentional weight loss, or pelvic pain, please call to see me sooner. ?

## 2022-03-22 ENCOUNTER — Encounter: Payer: Self-pay | Admitting: Gynecologic Oncology

## 2022-03-23 ENCOUNTER — Encounter: Payer: Self-pay | Admitting: Gynecologic Oncology

## 2022-03-23 ENCOUNTER — Inpatient Hospital Stay: Payer: Commercial Managed Care - PPO | Attending: Gynecologic Oncology | Admitting: Gynecologic Oncology

## 2022-03-23 ENCOUNTER — Other Ambulatory Visit: Payer: Self-pay

## 2022-03-23 ENCOUNTER — Other Ambulatory Visit (HOSPITAL_COMMUNITY)
Admission: RE | Admit: 2022-03-23 | Discharge: 2022-03-23 | Disposition: A | Discharge status: Home / Self Care | Payer: Commercial Managed Care - PPO | Source: Ambulatory Visit | Attending: Gynecologic Oncology | Admitting: Gynecologic Oncology

## 2022-03-23 VITALS — BP 125/58 | HR 70 | Temp 98.3°F | Resp 14 | Ht 64.0 in | Wt 200.8 lb

## 2022-03-23 DIAGNOSIS — C539 Malignant neoplasm of cervix uteri, unspecified: Secondary | ICD-10-CM | POA: Insufficient documentation

## 2022-03-23 DIAGNOSIS — Z90710 Acquired absence of both cervix and uterus: Secondary | ICD-10-CM | POA: Insufficient documentation

## 2022-03-23 DIAGNOSIS — Z90722 Acquired absence of ovaries, bilateral: Secondary | ICD-10-CM | POA: Insufficient documentation

## 2022-03-23 DIAGNOSIS — Z8541 Personal history of malignant neoplasm of cervix uteri: Secondary | ICD-10-CM

## 2022-03-23 NOTE — Patient Instructions (Signed)
It was good to see you today.  I do not see or feel any evidence of cancer recurrence on your exam.  I will release your Pap test results to you once back.    Please reach out to your OB/GYN to schedule a visit in the spring of next year.  I will see you back this time in the fall of next year.  Please call sometime in June or July to schedule that visit.  As always, please call if you develop any new and concerning symptoms.

## 2022-03-23 NOTE — Progress Notes (Signed)
Gynecologic Oncology Return Clinic Visit  03/23/2022  Reason for Visit: Surveillance visit in the setting of early stage cervical cancer  Treatment History: On January 23, 2018, she was seen by Dr. Radene Knee for her routine gynecologic examination.  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   For evaluation for the pap findings, she underwent a colposcopic examination of the cervix on February 07, 2018.  She did have acetowhite changes with slight punctation located on the left side of the cervix near the opening.  This was biopsied along with endocervical curettings taken.  The pathology from this colposcopy with biopsies revealed in the cervical biopsy at 3:00 adenocarcinoma, and in the endocervical curettage adenocarcinoma.  The comment and pathology was that both of the specimens show fragments of adenocarcinoma and while some of the features suggest an endometrial origin, differential includes endocervical and endometrial origin.  There is minimal stroma present in the tumor fragments which limits evaluation of invasion.   She denied postmenopausal bleeding, postcoital bleeding, abnormal discharge, or pelvic pain.  Her gynecologic history includes an endometrial ablation in approximately 2012 and has been amenorrheic since that time.  There is also evidence of a benign endometrial biopsy in 2011.  She reports a personal history of an abnormal pap smear in 1990 that was treated with cryotherapy.  She had an additional abnormal Pap smear some years later however this was cleared with treatment for an infection.  She denies having abnormal Pap smears since that time.  There is evidence of a cytologically benign Pap smear in 2016.  HPV testing was not performed on that specimen.   The patient's family history is significant for a maternal grandmother with a history of colon cancer,  and a maternal aunt with a history of cervical cancer.   On 02/21/18 she underwent cold knife conization of the cervix. Final pathology confirmed endocervical adenocarcinoma, moderately differentiated with positive endocervical margins. The tumor was at least 1.4cm wide and 0.4cm deep. The post cone ECC and endometrial curettage both showed endocervical adenocarcinoma. LVSI was not seen.    On 03/11/18 she had a PET scan resulting no evidence of local nodal metastasis or distant metastatic disease.    On 04/04/18, she underwent a robotic-assisted type III radical laparoscopic hysterectomy with bilateral salpingectomy and bilateral pelvic lymphadenectomy with Dr. Everitt Amber. Her final pathology showed an invasive cervical adenocarcinoma, moderately differentiated, spanning 2.5cm with 1.2cm length and 0.5cm depth. The cervical and vaginal margins and parametrium were negative and lymphadenectomy was negative for metastases. She was felt to have low risk factors for recurrence based on Sedlis and Ferdinand Lango criteria and was recommended close observation rather than adjuvant therapy.    She had some postop voiding issues which have subsequently resolved completely.   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 She was prescribed cyclosporin for treatment.  In February, 2020 she had been experiencing light pink vaginal spotting intermittently and pain in the left lower quadrant - worse with pressure applied to abdomen. This was worked up by a vaginal cuff biopsy which showed granulation tissue and a CT abd/pelvis on 10/02/18 which showed no findings to explain her abdominal pains.   She had a normal pap with negative high risk HPV in September, 2020. Similarly, in 03/2020 and 03/2021, paps were negative, HR HPV negative.  Interval History: Doing  well.  Enjoying the summer.  Has taken some short weekend trips.  Denies any vaginal bleeding or discharge.  Denies any pelvic or  abdominal pain.  Reports normal bowel function.  Reports baseline bladder function.  Past Medical/Surgical History: Past Medical History:  Diagnosis Date   Cervical cancer ([place])    Hemorrhoids    Hives    PONV (postoperative nausea and vomiting)    Postmenopausal    Seasonal allergies    Vitamin D deficiency     Past Surgical History:  Procedure Laterality Date   CERVICAL CONIZATION W/BX N/A 02/21/2018   Procedure: COLD KNIFE CONIZATION CERVIX;  Surgeon: Everitt Amber, MD;  Location: [place];  Service: Gynecology;  Laterality: N/A;   [place];  07-14-1999   dr Radene Knee [facility]   Bilateral Tubal Ligation w/ last one c/s   DILATION AND CURETTAGE OF UTERUS N/A 02/21/2018   Procedure: DILATATION AND CURETTAGE OF THE UTERUS;  Surgeon: Everitt Amber, MD;  Location[place];  Service: Gynecology;  Laterality: N/A;   ENDOMETRIAL ABLATION  2002  approx.    dr Radene Knee   HEMORRHOID SURGERY  02-20-2008    dr Brantley Stage  [facility]   PELVIC LYMPH NODE DISSECTION N/A 04/04/2018   Procedure: BILATERAL PELVIC LYMPHADENECTOMY;  Surgeon: Everitt Amber, MD;  Location: [facility];  Service: Gynecology;  Laterality: N/A;   ROBOTIC ASSISTED LAPAROSCOPIC HYSTERECTOMY AND SALPINGECTOMY Bilateral 04/04/2018   Procedure: XI ROBOTIC ASSISTED RADICAL HYSTERECTOMY AND SALPINGECTOMY;  Surgeon: Everitt Amber, MD;  Location: [facility];  Service: Gynecology;  Laterality: Bilateral;   TUBAL LIGATION     VAGINAL DELIVERY     1989    Family History  Problem Relation Age of Onset   Hypertension Mother    Lupus Sister    Cervical cancer Maternal Aunt    Colon cancer Maternal Grandfather     Social History   Socioeconomic History   Marital status: Married    Spouse name: Not on file   Number of children: Not on file   Years of education: Not on file   Highest education level: Not on file  Occupational History   Not on file  Tobacco Use   Smoking status: Former    Packs/day: 0.00     Years: 25.00    Total pack years: 0.00    Types: Cigarettes   Smokeless tobacco: Never   Tobacco comments:    quit in June 2019  Vaping Use   Vaping Use: Never used  Substance and Sexual Activity   Alcohol use: Yes    Comment: Ocassional   Drug use: Never   Sexual activity: Yes    Birth control/protection: Post-menopausal  Other Topics Concern   Not on file  Social History Narrative   Not on file   Social Determinants of Health   Financial Resource Strain: Not on file  Food Insecurity: Not on file  Transportation Needs: Not on file  Physical Activity: Not on file  Stress: Not on file  Social Connections: Not on file    Current Medications: No current outpatient medications on file.  Review of Systems: Denies appetite changes, fevers, chills, fatigue, unexplained weight changes. Denies hearing loss, neck lumps or masses, mouth sores, ringing in ears or voice changes. Denies cough or wheezing.  Denies shortness of breath. Denies chest pain or palpitations. Denies leg swelling. Denies abdominal distention, pain, blood in stools, constipation, diarrhea, nausea, vomiting, or early satiety. Denies pain with intercourse, dysuria, frequency,  hematuria or incontinence. Denies hot flashes, pelvic pain, vaginal bleeding or vaginal discharge.   Denies joint pain, back pain or muscle pain/cramps. Denies itching, rash, or wounds. Denies dizziness, headaches, numbness or seizures. Denies swollen lymph nodes or glands, denies easy bruising or bleeding. Denies anxiety, depression, confusion, or decreased concentration.  Physical Exam: BP (!) 125/58 (BP Location: Left Arm, Patient Position: Sitting)   Pulse 70   Temp 98.3 F (36.8 C) (Oral)   Resp 14   Ht '5\' 4"'$  (1.626 m)   Wt 200 lb 12.8 oz (91.1 kg)   SpO2 99%   BMI 34.47 kg/m  General: Alert, oriented, no acute distress. HEENT: Normocephalic, atraumatic, sclera anicteric. Chest: Clear to auscultation bilaterally.  No  wheezes or rhonchi. Cardiovascular: Regular rate and rhythm, no murmurs. Abdomen: Obese, soft, nontender.  Normoactive bowel sounds.  No masses or hepatosplenomegaly appreciated.  Well-healed incisions. Extremities: Grossly normal range of motion.  Warm, well perfused.  No edema bilaterally. Skin: No rashes or lesions noted. Lymphatics: No cervical, supraclavicular, or inguinal adenopathy. GU: Normal appearing external genitalia without erythema, excoriation, or lesions.  Small, 5 mm skin tag along right posterior buttock.  Speculum exam reveals mildly atrophic vaginal mucosa, cuff intact, no masses.  Pap and HPV testing collected.  Bimanual exam reveals no nodularity or masses.  Rectovaginal exam confirms these findings.  Laboratory & Radiologic Studies: None new  Assessment & Plan: Regina Salinas is a 56 y.o. woman with history of stage IB2 adenocarcinoma of the endocervix, s/p robotic-assisted type III radical laparoscopic hysterectomy with bilateral salpingectomy and bilateral pelvic lymphadenectomy in 03/2018.  Apparent low risk disease on final pathology. Adjuvant therapy was not indicated according to NCCN guidelines.     Patient is overall doing well and is NED on exam today.  Pap test collected today.  I will contact her with these results once available.   We discussed continued surveillance visits every 6 months until September 2024.  We reviewed signs and symptoms that would be concerning for cancer recurrence and I stressed that she should call if she develops any of these before her next visit.  Suggested that for her visit in the spring 2024 that she return to see Dr. Radene Knee, her OB/GYN.  This way, if she remains NED at her visit in September 2024 with me, she will be ready for discharge and will have already reestablished with her OB/GYN.   Continue annual cotesting.  20 minutes of total time was spent for this patient encounter, including preparation, face-to-face counseling with  the patient and coordination of care, and documentation of the encounter.  Jeral Pinch, MD  Division of Gynecologic Oncology  Department of Obstetrics and Gynecology  [facility]

## 2022-03-29 LAB — CYTOLOGY - PAP
Comment: NEGATIVE
Diagnosis: NEGATIVE
High risk HPV: NEGATIVE

## 2022-09-12 ENCOUNTER — Other Ambulatory Visit: Payer: Self-pay | Admitting: Obstetrics and Gynecology

## 2022-09-12 DIAGNOSIS — R928 Other abnormal and inconclusive findings on diagnostic imaging of breast: Secondary | ICD-10-CM

## 2022-09-28 ENCOUNTER — Other Ambulatory Visit: Payer: Commercial Managed Care - PPO

## 2023-01-29 ENCOUNTER — Telehealth: Payer: Self-pay | Admitting: *Deleted

## 2023-01-29 NOTE — Telephone Encounter (Signed)
Patient scheduled for a follow up appt with Dr Tucker  ?

## 2023-03-15 ENCOUNTER — Encounter: Payer: Self-pay | Admitting: Gynecologic Oncology

## 2023-03-15 ENCOUNTER — Other Ambulatory Visit (HOSPITAL_COMMUNITY): Admission: RE | Admit: 2023-03-15 | Payer: Commercial Managed Care - PPO | Source: Ambulatory Visit

## 2023-03-15 ENCOUNTER — Other Ambulatory Visit: Payer: Self-pay

## 2023-03-15 ENCOUNTER — Inpatient Hospital Stay: Payer: Commercial Managed Care - PPO | Admitting: Gynecologic Oncology

## 2023-03-15 VITALS — BP 135/60 | HR 58 | Temp 98.4°F | Resp 17 | Wt 209.4 lb

## 2023-03-15 DIAGNOSIS — K625 Hemorrhage of anus and rectum: Secondary | ICD-10-CM | POA: Diagnosis not present

## 2023-03-15 DIAGNOSIS — C539 Malignant neoplasm of cervix uteri, unspecified: Secondary | ICD-10-CM | POA: Diagnosis not present

## 2023-03-15 DIAGNOSIS — Z9079 Acquired absence of other genital organ(s): Secondary | ICD-10-CM | POA: Diagnosis not present

## 2023-03-15 DIAGNOSIS — Z8541 Personal history of malignant neoplasm of cervix uteri: Secondary | ICD-10-CM | POA: Insufficient documentation

## 2023-03-15 DIAGNOSIS — Z90710 Acquired absence of both cervix and uterus: Secondary | ICD-10-CM | POA: Insufficient documentation

## 2023-03-15 NOTE — Progress Notes (Signed)
Gynecologic Oncology Return Clinic Visit  03/15/23  Reason for Visit: Surveillance visit in the setting of early stage cervical cancer   Treatment History: On January 23, 2018, she was seen by Dr. Arelia Sneddon for her routine gynecologic examination.  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   For evaluation for the pap findings, she underwent a colposcopic examination of the cervix on February 07, 2018.  She did have acetowhite changes with slight punctation located on the left side of the cervix near the opening.  This was biopsied along with endocervical curettings taken.  The pathology from this colposcopy with biopsies revealed in the cervical biopsy at 3:00 adenocarcinoma, and in the endocervical curettage adenocarcinoma.  The comment and pathology was that both of the specimens show fragments of adenocarcinoma and while some of the features suggest an endometrial origin, differential includes endocervical and endometrial origin.  There is minimal stroma present in the tumor fragments which limits evaluation of invasion.   She denied postmenopausal bleeding, postcoital bleeding, abnormal discharge, or pelvic pain.  Her gynecologic history includes an endometrial ablation in approximately 2012 and has been amenorrheic since that time.  There is also evidence of a benign endometrial biopsy in 2011.  She reports a personal history of an abnormal pap smear in 1990 that was treated with cryotherapy.  She had an additional abnormal Pap smear some years later however this was cleared with treatment for an infection.  She denies having abnormal Pap smears since that time.  There is evidence of a cytologically benign Pap smear in 2016.  HPV testing was not performed on that specimen.   The patient's family history is significant for a maternal grandmother with a history of colon cancer,  and a maternal aunt with a history of cervical cancer.   On 02/21/18 she underwent cold knife conization of the cervix. Final pathology confirmed endocervical adenocarcinoma, moderately differentiated with positive endocervical margins. The tumor was at least 1.4cm wide and 0.4cm deep. The post cone ECC and endometrial curettage both showed endocervical adenocarcinoma. LVSI was not seen.    On 03/11/18 she had a PET scan resulting no evidence of local nodal metastasis or distant metastatic disease.    On 04/04/18, she underwent a robotic-assisted type III radical laparoscopic hysterectomy with bilateral salpingectomy and bilateral pelvic lymphadenectomy with Dr. Adolphus Birchwood. Her final pathology showed an invasive cervical adenocarcinoma, moderately differentiated, spanning 2.5cm with 1.2cm length and 0.5cm depth. The cervical and vaginal margins and parametrium were negative and lymphadenectomy was negative for metastases. She was felt to have low risk factors for recurrence based on Sedlis and Noe Gens criteria and was recommended close observation rather than adjuvant therapy.    She had some postop voiding issues which have subsequently resolved completely.   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 She was prescribed cyclosporin for treatment.  In February, 2020 she had been experiencing light pink vaginal spotting intermittently and pain in the left lower quadrant - worse with pressure applied to abdomen. This was worked up by a vaginal cuff biopsy which showed granulation tissue and a CT abd/pelvis on 10/02/18 which showed no findings to explain her abdominal pains.   She had a normal pap with negative high risk HPV in September, 2020. Similarly, in 03/2020 and 03/2021, paps were negative, HR HPV negative.  Interval History:  Doing well.  Denies any vaginal bleeding or discharge.  Denies any abdominal or pelvic pain.  Reports normal bladder function.  Patient  reports that she goes back to [facility] next week for repeat mammogram and ultrasound secondary to some findings at the time of her last scan including a fibrocystic changes in the right breast.  Patient underwent colonoscopy on 7/10, reported to be normal.  Since early August, the patient has had 4 episodes of bright red bleeding per rectum.  1 was after an episode of diarrhea.  Her last episode yesterday was just small spotting but she had a large episode of bleeding on Saturday.  Past Medical/Surgical History: Past Medical History:  Diagnosis Date   Cervical cancer (HCC)    Hemorrhoids    Hives    PONV (postoperative nausea and vomiting)    Postmenopausal    Seasonal allergies    Vitamin D deficiency     Past Surgical History:  Procedure Laterality Date   CERVICAL CONIZATION W/BX N/A 02/21/2018   Procedure: COLD KNIFE CONIZATION CERVIX;  Surgeon: Adolphus Birchwood, MD;  Location: [facility];  Service: Gynecology;  Laterality: N/A;   CESAREAN SECTION  1995;  07-14-1999   dr Arelia Sneddon [facility]   Bilateral Tubal Ligation w/ last one c/s   DILATION AND CURETTAGE OF UTERUS N/A 02/21/2018   Procedure: DILATATION AND CURETTAGE OF THE UTERUS;  Surgeon: Adolphus Birchwood, MD;  Location: [facility] ;  Service: Gynecology;  Laterality: N/A;   ENDOMETRIAL ABLATION  2002  approx.    dr Arelia Sneddon   HEMORRHOID SURGERY  02-20-2008    dr Luisa Hart  [facility]   PELVIC LYMPH NODE DISSECTION N/A 04/04/2018   Procedure: BILATERAL PELVIC LYMPHADENECTOMY;  Surgeon: Adolphus Birchwood, MD;  Location: [facility];  Service: Gynecology;  Laterality: N/A;   ROBOTIC ASSISTED LAPAROSCOPIC HYSTERECTOMY AND SALPINGECTOMY Bilateral 04/04/2018   Procedure: XI ROBOTIC ASSISTED RADICAL HYSTERECTOMY AND SALPINGECTOMY;  Surgeon: Adolphus Birchwood, MD;  Location: [facility];  Service: Gynecology;  Laterality: Bilateral;   TUBAL LIGATION     VAGINAL DELIVERY     1989    Family History  Problem Relation Age of Onset   Hypertension Mother     Lupus Sister    Cervical cancer Maternal Aunt    Colon cancer Maternal Grandfather     Social History   Socioeconomic History   Marital status: Married    Spouse name: Not on file   Number of children: Not on file   Years of education: Not on file   Highest education level: Not on file  Occupational History   Not on file  Tobacco Use   Smoking status: Former    Current packs/day: 0.00    Types: Cigarettes   Smokeless tobacco: Never   Tobacco comments:    quit in June 2019  Vaping Use   Vaping status: Never Used  Substance and Sexual Activity   Alcohol use: Yes    Comment: Ocassional   Drug use: Never   Sexual activity: Yes    Birth control/protection: Post-menopausal  Other Topics Concern   Not on file  Social History Narrative   Not on file   Social Determinants of Health   Financial Resource Strain: Not on file  Food Insecurity: Not on file  Transportation Needs: Not on file  Physical Activity: Not on file  Stress: Not on file  Social Connections: Unknown (12/12/2021)   [facility]   Social Network  Social Network: Not on file    Current Medications: No current outpatient medications on file.  Review of Systems: + blood in stool Denies appetite changes, fevers, chills, fatigue, unexplained weight changes. Denies hearing loss, neck lumps or masses, mouth sores, ringing in ears or voice changes. Denies cough or wheezing.  Denies shortness of breath. Denies chest pain or palpitations. Denies leg swelling. Denies abdominal distention, pain, constipation, diarrhea, nausea, vomiting, or early satiety. Denies pain with intercourse, dysuria, frequency, hematuria or incontinence. Denies hot flashes, pelvic pain, vaginal bleeding or vaginal discharge.   Denies joint pain, back pain or muscle pain/cramps. Denies itching, rash, or wounds. Denies dizziness, headaches, numbness or seizures. Denies swollen lymph nodes or glands, denies  easy bruising or bleeding. Denies anxiety, depression, confusion, or decreased concentration.  Physical Exam: BP 135/60 (BP Location: Left Arm, Patient Position: Sitting)   Pulse (!) 58   Temp 98.4 F (36.9 C) (Oral)   Resp 17   Wt 209 lb 6.4 oz (95 kg)   SpO2 100%   BMI 35.94 kg/m  General: Alert, oriented, no acute distress. HEENT: Normocephalic, atraumatic, sclera anicteric. Chest: Clear to auscultation bilaterally.  No wheezes or rhonchi. Cardiovascular: Regular rate and rhythm, no murmurs. Abdomen: Obese, soft, nontender.  Normoactive bowel sounds.  No masses or hepatosplenomegaly appreciated.  Well-healed incisions. Extremities: Grossly normal range of motion.  Warm, well perfused.  No edema bilaterally. Skin: No rashes or lesions noted. Lymphatics: No cervical, supraclavicular, or inguinal adenopathy. GU: Normal appearing external genitalia without erythema, excoriation, or lesions.  Small, 5 mm skin tag along right posterior buttock.  Speculum exam reveals mildly atrophic vaginal mucosa, cuff intact, no masses.  Pap and HPV testing collected.  Bimanual exam reveals no nodularity or masses.  Rectovaginal exam confirms these findings.  On exam of the anus, no definitive external hemorrhoids noted.  On rectal exam, I do not palpate large internal hemorrhoids.  No evidence of areas that have been recently bleeding externally.  Laboratory & Radiologic Studies: 02/2022: pap NIML, HR HPV negative  Assessment & Plan: Regina Salinas is a 57 y.o. woman with a history of stage IB2 adenocarcinoma of the endocervix, s/p robotic-assisted type III radical laparoscopic hysterectomy with bilateral salpingectomy and bilateral pelvic lymphadenectomy in 03/2018.  Apparent low risk disease on final pathology. Adjuvant therapy was not indicated according to NCCN guidelines.     Patient is overall doing well and is NED on exam today.  Pap test collected today.  I will contact her with these results once  available.   She is now just shy of 5 years out from her initial treatment.  We discussed graduating from oncology care and continuing with a yearly visit to see her OB/GYN.  I suggested that she do this every summer and have cotesting (Pap and HPV ) performed at that time.  Cotesting was done today.    We reviewed signs and symptoms that would be concerning for cancer recurrence and I stressed that she should call her OBGYN or my office if she develops any of these before her next visit.    We discussed her recent rectal/anal bleeding.  I suggested she could do a trial of Preparation H.  She has had prior hemorrhoids with hemorrhoidectomy surgery.  I encouraged her to follow-up with GI if bleeding continues.  20 minutes of total time was spent for this patient encounter, including preparation, face-to-face counseling with the patient and coordination of care, and documentation of the encounter.  Eugene Garnet, MD  Division of Gynecologic Oncology  Department of Obstetrics and Gynecology  [facility]

## 2023-03-15 NOTE — Patient Instructions (Signed)
It was good to see you today.  Congratulations on 5 years of cancer free.  Please continue to see your OB/GYN every year for a visit and Pap/HPV testing.  I will let you know in MyChart when your Pap test comes back from today.  As always, if you develop any concerning symptoms such as vaginal bleeding or pelvic pain, please either call your OB/GYN or my office.  If bleeding from your rectum/anus continues, please call GI for additional follow-up.

## 2023-03-19 LAB — CYTOLOGY - PAP
Comment: NEGATIVE
Diagnosis: NEGATIVE
High risk HPV: NEGATIVE

## 2023-10-03 ENCOUNTER — Other Ambulatory Visit: Payer: Self-pay | Admitting: Obstetrics and Gynecology

## 2023-10-03 DIAGNOSIS — Z87891 Personal history of nicotine dependence: Secondary | ICD-10-CM

## 2023-10-08 ENCOUNTER — Inpatient Hospital Stay: Admission: RE | Admit: 2023-10-08 | Source: Ambulatory Visit
